# Patient Record
Sex: Female | Born: 1965 | Race: Asian | Hispanic: No | Marital: Married | Smoking: Never smoker
Health system: Southern US, Community
[De-identification: ages and names within clinical notes are randomized; demographics above are authoritative.]

## PROBLEM LIST (undated history)

## (undated) DIAGNOSIS — Z9221 Personal history of antineoplastic chemotherapy: Secondary | ICD-10-CM

## (undated) DIAGNOSIS — Z923 Personal history of irradiation: Secondary | ICD-10-CM

## (undated) DIAGNOSIS — I10 Essential (primary) hypertension: Secondary | ICD-10-CM

## (undated) HISTORY — PX: PORT-A-CATH REMOVAL: SHX5289

## (undated) HISTORY — DX: Personal history of irradiation: Z92.3

## (undated) HISTORY — DX: Essential (primary) hypertension: I10

---

## 2019-08-01 HISTORY — PX: HYSTEROSCOPY: SHX211

## 2019-08-14 DIAGNOSIS — N950 Postmenopausal bleeding: Secondary | ICD-10-CM | POA: Insufficient documentation

## 2019-08-14 DIAGNOSIS — D069 Carcinoma in situ of cervix, unspecified: Secondary | ICD-10-CM | POA: Insufficient documentation

## 2019-09-01 HISTORY — PX: INSERTION CENTRAL VENOUS ACCESS DEVICE W/ SUBCUTANEOUS PORT: SUR725

## 2019-09-20 DIAGNOSIS — C531 Malignant neoplasm of exocervix: Secondary | ICD-10-CM | POA: Insufficient documentation

## 2020-10-31 DIAGNOSIS — C801 Malignant (primary) neoplasm, unspecified: Secondary | ICD-10-CM

## 2020-10-31 HISTORY — DX: Malignant (primary) neoplasm, unspecified: C80.1

## 2021-09-04 DIAGNOSIS — I10 Essential (primary) hypertension: Secondary | ICD-10-CM | POA: Insufficient documentation

## 2022-02-03 ENCOUNTER — Encounter: Payer: Self-pay | Admitting: Physician Assistant

## 2022-02-03 ENCOUNTER — Ambulatory Visit (INDEPENDENT_AMBULATORY_CARE_PROVIDER_SITE_OTHER): Payer: 59 | Admitting: Physician Assistant

## 2022-02-03 ENCOUNTER — Encounter (INDEPENDENT_AMBULATORY_CARE_PROVIDER_SITE_OTHER): Payer: Self-pay

## 2022-02-03 ENCOUNTER — Telehealth: Payer: Self-pay

## 2022-02-03 DIAGNOSIS — Z7689 Persons encountering health services in other specified circumstances: Secondary | ICD-10-CM | POA: Diagnosis not present

## 2022-02-03 DIAGNOSIS — Z1231 Encounter for screening mammogram for malignant neoplasm of breast: Secondary | ICD-10-CM

## 2022-02-03 DIAGNOSIS — Z8541 Personal history of malignant neoplasm of cervix uteri: Secondary | ICD-10-CM

## 2022-02-03 DIAGNOSIS — I10 Essential (primary) hypertension: Secondary | ICD-10-CM | POA: Diagnosis not present

## 2022-02-03 DIAGNOSIS — R5383 Other fatigue: Secondary | ICD-10-CM

## 2022-02-03 MED ORDER — LOSARTAN POTASSIUM 25 MG PO TABS: 25.0000 mg | ORAL_TABLET | Freq: Every day | ORAL | 1 refills | Status: DC

## 2022-02-03 NOTE — Telephone Encounter (Signed)
Notified patient's son of mammogram appointment date & time-Toni ?

## 2022-02-03 NOTE — Progress Notes (Signed)
[facility] ?[address] ?[facility], [place] [address] ? ?Internal MEDICINE  ?Office Visit Note ? ?Patient Name: Kathryn Robbins ? [id number]  ?[id number] ? ?Date of Service: 02/09/2022 ? ? ?Complaints/HPI ?Pt is here for establishment of PCP. ?Chief Complaint  ?Patient presents with  ? New Patient (Initial Visit)  ? ?HPI ?Pt is here for establish care ?-moved from [place] 6 months ago  ?-hx of cervical cancer in 2020, treated with cisplatin and radiation by GYN-ONC. Was previously undergoing repeat imaging and is almost due for this at this time based on previous provider's treatment plan.  Patient therefore needs to establish with local GYN-ONC. ?-Has HTN and takes losartan. Has not taken it yet today. Does not check at home but does have a cuff if needed. ?-BP usually high in offices, did not improve on recheck ?-No other chronic medical conditions or medications aside from losartan ?-never been a smoker, does not drink any alcohol, no other substance use ?-lives with her son, daughter and husband ?-Does not work, does house work and will go for walks ?-Colonoscopy never done, is due for mammogram and would like this ordered today ?-We will also have her do routine fasting blood work ? ?Current Medication: ?Outpatient Encounter Medications as of 02/03/2022  ?Medication Sig  ? [DISCONTINUED] losartan (COZAAR) 25 MG tablet Take 25 mg by mouth daily.  ? losartan (COZAAR) 25 MG tablet Take 1 tablet (25 mg total) by mouth daily.  ? ?No facility-administered encounter medications on file as of 02/03/2022.  ? ? ?Surgical History: ?History reviewed. No pertinent surgical history. ? ?Medical History: ?Past Medical History:  ?Diagnosis Date  ? Cancer [facility]) 2022  ? Cervical  ? ? ?Family History: ?History reviewed. No pertinent family history. ? ?Social History  ? ?Socioeconomic History  ? Marital status: Unknown  ?  Spouse name: Not on file  ? Number of children: Not on file  ? Years of education: Not on file  ?  Highest education level: Not on file  ?Occupational History  ? Not on file  ?Tobacco Use  ? Smoking status: Never  ? Smokeless tobacco: Never  ?Substance and Sexual Activity  ? Alcohol use: Never  ? Drug use: Never  ? Sexual activity: Not on file  ?Other Topics Concern  ? Not on file  ?Social History Narrative  ? Not on file  ? ?Social Determinants of Health  ? ?Financial Resource Strain: Not on file  ?Food Insecurity: Not on file  ?Transportation Needs: Not on file  ?Physical Activity: Not on file  ?Stress: Not on file  ?Social Connections: Not on file  ?Intimate Partner Violence: Not on file  ? ? ? ?Review of Systems  ?Constitutional:  Negative for chills, fatigue and unexpected weight change.  ?HENT:  Negative for congestion, rhinorrhea, sneezing and sore throat.   ?Eyes:  Negative for redness.  ?Respiratory:  Negative for cough, chest tightness and shortness of breath.   ?Cardiovascular:  Negative for chest pain and palpitations.  ?Gastrointestinal:  Negative for abdominal pain, constipation, diarrhea, nausea and vomiting.  ?Genitourinary:  Negative for dysuria and frequency.  ?Musculoskeletal:  Negative for arthralgias, back pain, joint swelling and neck pain.  ?Skin:  Negative for rash.  ?Neurological: Negative.  Negative for tremors and numbness.  ?Hematological:  Negative for adenopathy. Does not bruise/bleed easily.  ?Psychiatric/Behavioral:  Negative for behavioral problems (Depression), sleep disturbance and suicidal ideas. The patient is not nervous/anxious.   ? ?Vital Signs: ?BP (!) 158/83  Pulse 87   Temp 98.1 ?F (36.7 ?C)   Resp 16   Ht '5\' 1"'$  (1.549 m)   Wt 112 lb 4.8 oz (50.9 kg)   SpO2 99%   BMI 21.22 kg/m?  ? ? ?Physical Exam ?Vitals and nursing note reviewed.  ?Constitutional:   ?   General: She is not in acute distress. ?   Appearance: She is well-developed. She is not diaphoretic.  ?HENT:  ?   Head: Normocephalic and atraumatic.  ?   Mouth/Throat:  ?   Pharynx: No oropharyngeal exudate.   ?Eyes:  ?   Pupils: Pupils are equal, round, and reactive to light.  ?Neck:  ?   Thyroid: No thyromegaly.  ?   Vascular: No JVD.  ?   Trachea: No tracheal deviation.  ?Cardiovascular:  ?   Rate and Rhythm: Normal rate and regular rhythm.  ?   Heart sounds: Normal heart sounds. No murmur heard. ?  No friction rub. No gallop.  ?Pulmonary:  ?   Effort: Pulmonary effort is normal. No respiratory distress.  ?   Breath sounds: No wheezing or rales.  ?Chest:  ?   Chest wall: No tenderness.  ?Abdominal:  ?   General: Bowel sounds are normal.  ?   Palpations: Abdomen is soft.  ?Musculoskeletal:     ?   General: Normal range of motion.  ?   Cervical back: Normal range of motion and neck supple.  ?Lymphadenopathy:  ?   Cervical: No cervical adenopathy.  ?Skin: ?   General: Skin is warm and dry.  ?Neurological:  ?   Mental Status: She is alert and oriented to person, place, and time.  ?   Cranial Nerves: No cranial nerve deficit.  ?Psychiatric:     ?   Behavior: Behavior normal.     ?   Thought Content: Thought content normal.     ?   Judgment: Judgment normal.  ? ? ? ? ?Assessment/Plan: ?1. Essential hypertension ?Elevated in office though patient has not taken her medication yet today and will do so now.  Will also monitor at home and bring recordings next visit.  We will have 4-week follow-up to recheck this as BP did not improve on recheck in office ?- losartan (COZAAR) 25 MG tablet; Take 1 tablet (25 mg total) by mouth daily.  Dispense: 90 tablet; Refill: 1 ? ?2. History of cervical cancer ?Go ahead and refer to local gynecologic oncology provider as patient was being routinely monitored posterior cervical cancer diagnosis in 2020 and requires follow-up imaging and close monitoring ?- Ambulatory referral to Gynecologic Oncology ? ?3. Visit for screening mammogram ?- MM DIGITAL SCREENING BILATERAL; Future ?- MM 3D SCREEN BREAST BILATERAL; Future ? ?4. Encounter to establish care ?- CBC w/Diff/Platelet ?- Comprehensive  metabolic panel ?- TSH + free T4 ?- Lipid Panel With LDL/HDL Ratio ? ?5. Other fatigue ?- CBC w/Diff/Platelet ?- Comprehensive metabolic panel ?- TSH + free T4 ?- Lipid Panel With LDL/HDL Ratio ? ? ?General Counseling: Michalina verbalizes understanding of the findings of todays visit and agrees with plan of treatment. I have discussed any further diagnostic evaluation that may be needed or ordered today. We also reviewed her medications today. she has been encouraged to call the office with any questions or concerns that should arise related to todays visit. ? ? ? ?Counseling: ? ? ? ?Orders Placed This Encounter  ?Procedures  ? MM DIGITAL SCREENING BILATERAL  ? MM 3D SCREEN BREAST BILATERAL  ?  CBC w/Diff/Platelet  ? Comprehensive metabolic panel  ? TSH + free T4  ? Lipid Panel With LDL/HDL Ratio  ? Ambulatory referral to Gynecologic Oncology  ? ? ?Meds ordered this encounter  ?Medications  ? losartan (COZAAR) 25 MG tablet  ?  Sig: Take 1 tablet (25 mg total) by mouth daily.  ?  Dispense:  90 tablet  ?  Refill:  1  ? ? ? ?This patient was seen by Drema Dallas, PA-C in collaboration with Dr. Clayborn Bigness as a part of collaborative care agreement. ? ? ?Time spent:40 Minutes ? ? ? ? ? ? ?

## 2022-02-04 LAB — CBC WITH DIFFERENTIAL/PLATELET
Basophils Absolute: 0 10*3/uL (ref 0.0–0.2)
Basos: 0 %
EOS (ABSOLUTE): 0.1 10*3/uL (ref 0.0–0.4)
Eos: 1 %
Hematocrit: 37.2 % (ref 34.0–46.6)
Hemoglobin: 12.1 g/dL (ref 11.1–15.9)
Immature Grans (Abs): 0 10*3/uL (ref 0.0–0.1)
Immature Granulocytes: 0 %
Lymphocytes Absolute: 2.1 10*3/uL (ref 0.7–3.1)
Lymphs: 23 %
MCH: 25.4 pg — ABNORMAL LOW (ref 26.6–33.0)
MCHC: 32.5 g/dL (ref 31.5–35.7)
MCV: 78 fL — ABNORMAL LOW (ref 79–97)
Monocytes Absolute: 0.4 10*3/uL (ref 0.1–0.9)
Monocytes: 5 %
Neutrophils Absolute: 6.8 10*3/uL (ref 1.4–7.0)
Neutrophils: 71 %
Platelets: 240 10*3/uL (ref 150–450)
RBC: 4.76 x10E6/uL (ref 3.77–5.28)
RDW: 12.5 % (ref 11.7–15.4)
WBC: 9.4 10*3/uL (ref 3.4–10.8)

## 2022-02-04 LAB — COMPREHENSIVE METABOLIC PANEL
ALT: 17 IU/L (ref 0–32)
AST: 18 IU/L (ref 0–40)
Albumin/Globulin Ratio: 1.7 (ref 1.2–2.2)
Albumin: 4.5 g/dL (ref 3.8–4.9)
Alkaline Phosphatase: 115 IU/L (ref 44–121)
BUN/Creatinine Ratio: 19 (ref 9–23)
BUN: 13 mg/dL (ref 6–24)
Bilirubin Total: 0.5 mg/dL (ref 0.0–1.2)
CO2: 21 mmol/L (ref 20–29)
Calcium: 9.3 mg/dL (ref 8.7–10.2)
Chloride: 104 mmol/L (ref 96–106)
Creatinine, Ser: 0.7 mg/dL (ref 0.57–1.00)
Globulin, Total: 2.6 g/dL (ref 1.5–4.5)
Glucose: 95 mg/dL (ref 70–99)
Potassium: 4.4 mmol/L (ref 3.5–5.2)
Sodium: 137 mmol/L (ref 134–144)
Total Protein: 7.1 g/dL (ref 6.0–8.5)
eGFR: 101 mL/min/{1.73_m2} (ref >59)

## 2022-02-04 LAB — LIPID PANEL WITH LDL/HDL RATIO
Cholesterol, Total: 210 mg/dL — ABNORMAL HIGH (ref 100–199)
HDL: 58 mg/dL (ref >39)
LDL Chol Calc (NIH): 137 mg/dL — ABNORMAL HIGH (ref 0–99)
LDL/HDL Ratio: 2.4 ratio (ref 0.0–3.2)
Triglycerides: 85 mg/dL (ref 0–149)
VLDL Cholesterol Cal: 15 mg/dL (ref 5–40)

## 2022-02-04 LAB — TSH+FREE T4
Free T4: 1.43 ng/dL (ref 0.82–1.77)
TSH: 1.59 u[IU]/mL (ref 0.450–4.500)

## 2022-02-08 ENCOUNTER — Telehealth: Payer: Self-pay

## 2022-02-08 ENCOUNTER — Telehealth: Payer: Self-pay | Admitting: *Deleted

## 2022-02-08 NOTE — Telephone Encounter (Signed)
Called and spoke with Kenney Houseman at [employer] [facility] office; requested cancer records from [place]  ?

## 2022-02-08 NOTE — Telephone Encounter (Signed)
Emailed medical release to patient's son to complete and return for records from [place]-Toni ?

## 2022-02-11 NOTE — Telephone Encounter (Signed)
Medical record request faxed to Dr. Oletta Lamas [phone] ?

## 2022-02-24 NOTE — Telephone Encounter (Signed)
Spoke with office, they stated they had not received request. I faxed again-Toni ?

## 2022-03-01 ENCOUNTER — Telehealth: Payer: Self-pay | Admitting: *Deleted

## 2022-03-01 NOTE — Telephone Encounter (Signed)
Spoke with the patient and scheduled a new patient appt with Dr Berline Lopes on 5/26 at 11:15 am.  Patient given an arrival time of *10:45 am. Patient also given the address and phone number for the clinic; along with the policy for mask and visitors.   ?

## 2022-03-03 ENCOUNTER — Ambulatory Visit (INDEPENDENT_AMBULATORY_CARE_PROVIDER_SITE_OTHER): Payer: 59 | Admitting: Physician Assistant

## 2022-03-03 ENCOUNTER — Encounter: Payer: Self-pay | Admitting: Physician Assistant

## 2022-03-03 DIAGNOSIS — Z8541 Personal history of malignant neoplasm of cervix uteri: Secondary | ICD-10-CM

## 2022-03-03 DIAGNOSIS — I10 Essential (primary) hypertension: Secondary | ICD-10-CM

## 2022-03-03 DIAGNOSIS — E782 Mixed hyperlipidemia: Secondary | ICD-10-CM | POA: Diagnosis not present

## 2022-03-03 MED ORDER — ROSUVASTATIN CALCIUM 5 MG PO TABS: ORAL_TABLET | ORAL | 3 refills | Status: DC

## 2022-03-03 NOTE — Progress Notes (Signed)
[facility] [place],  [address]  Internal MEDICINE  Office Visit Note  Patient Name: Kathryn Robbins  [id number]  [id number]  Date of Service: 03/09/2022  Chief Complaint  Patient presents with   Follow-up    HPI Pt ids here for follow up for HTN with her son -BP 140-150/80-90 at home before medication. Took her losartan at 8:30 this morning. Gets anxious in office and reads higher. -Will bring cuff next visit with log to ensure it is just elevated in office from white coat syndrome. May need to double losartan if elevated at home too. -Labs reviewed and showed elevated cholesterol--she is taking fish oil daily, will add '5mg'$  crestor twice per week. Her other labs were normal apart from low MCV, unfortunately iron studies could not be added and may need to check this in future. She denies any symptoms -She has her initial visit with GYN-ONC to establish with local provider to follow her cervical cancer later this month and will have mammogram as well  Current Medication: Outpatient Encounter Medications as of 03/03/2022  Medication Sig   losartan (COZAAR) 25 MG tablet Take 1 tablet (25 mg total) by mouth daily.   rosuvastatin (CRESTOR) 5 MG tablet Take 1 tablet by mouth at night twice per week   No facility-administered encounter medications on file as of 03/03/2022.    Surgical History: History reviewed. No pertinent surgical history.  Medical History: Past Medical History:  Diagnosis Date   Cancer ([place]) 2022   Cervical    Family History: History reviewed. No pertinent family history.  Social History   Socioeconomic History   Marital status: Unknown    Spouse name: Not on file   Number of children: Not on file   Years of education: Not on file   Highest education level: Not on file  Occupational History   Not on file  Tobacco Use   Smoking status: Never   Smokeless tobacco: Never  Substance and Sexual Activity   Alcohol use:  Never   Drug use: Never   Sexual activity: Not on file  Other Topics Concern   Not on file  Social History Narrative   Not on file   Social Determinants of Health   Financial Resource Strain: Not on file  Food Insecurity: Not on file  Transportation Needs: Not on file  Physical Activity: Not on file  Stress: Not on file  Social Connections: Not on file  Intimate Partner Violence: Not on file      Review of Systems  Constitutional:  Negative for chills, fatigue and unexpected weight change.  HENT:  Negative for congestion, rhinorrhea, sneezing and sore throat.   Eyes:  Negative for redness.  Respiratory:  Negative for cough, chest tightness and shortness of breath.   Cardiovascular:  Negative for chest pain and palpitations.  Gastrointestinal:  Negative for abdominal pain, constipation, diarrhea, nausea and vomiting.  Genitourinary:  Negative for dysuria and frequency.  Musculoskeletal:  Negative for arthralgias, back pain, joint swelling and neck pain.  Skin:  Negative for rash.  Neurological: Negative.  Negative for tremors and numbness.  Hematological:  Negative for adenopathy. Does not bruise/bleed easily.  Psychiatric/Behavioral:  Negative for behavioral problems (Depression), sleep disturbance and suicidal ideas. The patient is nervous/anxious.    Vital Signs: BP (!) 162/92 Comment: 181/89  Pulse 78   Temp 97.6 F (36.4 C)   Resp 16   Ht '5\' 1"'$  (1.549 m)   Wt 113 lb (51.3 kg)  SpO2 98%   BMI 21.35 kg/m    Physical Exam Vitals and nursing note reviewed.  Constitutional:      General: She is not in acute distress.    Appearance: She is well-developed. She is not diaphoretic.  HENT:     Head: Normocephalic and atraumatic.     Mouth/Throat:     Pharynx: No oropharyngeal exudate.  Eyes:     Pupils: Pupils are equal, round, and reactive to light.  Neck:     Thyroid: No thyromegaly.     Vascular: No JVD.     Trachea: No tracheal deviation.  Cardiovascular:      Rate and Rhythm: Normal rate and regular rhythm.     Heart sounds: Normal heart sounds. No murmur heard.   No friction rub. No gallop.  Pulmonary:     Effort: Pulmonary effort is normal. No respiratory distress.     Breath sounds: No wheezing or rales.  Chest:     Chest wall: No tenderness.  Abdominal:     General: Bowel sounds are normal.     Palpations: Abdomen is soft.  Musculoskeletal:        General: Normal range of motion.     Cervical back: Normal range of motion and neck supple.  Lymphadenopathy:     Cervical: No cervical adenopathy.  Skin:    General: Skin is warm and dry.  Neurological:     Mental Status: She is alert and oriented to person, place, and time.     Cranial Nerves: No cranial nerve deficit.  Psychiatric:        Behavior: Behavior normal.        Thought Content: Thought content normal.        Judgment: Judgment normal.       Assessment/Plan: 1. Essential hypertension Elevated in office again. Will monitor at home and if elevated will double losartan. Will bring cuff next visit to ensure elevated in office readings due to white coat syndrome and not due to poorly controlled HTN at home too  2. Mixed hyperlipidemia Will start crestor twice per week while continuing fish oil and working on diet and exercise - rosuvastatin (CRESTOR) 5 MG tablet; Take 1 tablet by mouth at night twice per week  Dispense: 30 tablet; Refill: 3  3. History of cervical cancer Has visit to establish with local provider to take over management of cervical cancer   General Counseling: Christyanna verbalizes understanding of the findings of todays visit and agrees with plan of treatment. I have discussed any further diagnostic evaluation that may be needed or ordered today. We also reviewed her medications today. she has been encouraged to call the office with any questions or concerns that should arise related to todays visit.    No orders of the defined types were placed  in this encounter.   Meds ordered this encounter  Medications   rosuvastatin (CRESTOR) 5 MG tablet    Sig: Take 1 tablet by mouth at night twice per week    Dispense:  30 tablet    Refill:  3    This patient was seen by Drema Dallas, PA-C in collaboration with Dr. Clayborn Bigness as a part of collaborative care agreement.   Total time spent:30 Minutes Time spent includes review of chart, medications, test results, and follow up plan with the patient.      Dr Lavera Guise Internal medicine

## 2022-03-16 ENCOUNTER — Ambulatory Visit
Admission: RE | Admit: 2022-03-16 | Discharge: 2022-03-16 | Disposition: A | Discharge status: Home / Self Care | Payer: 59 | Source: Ambulatory Visit | Attending: Physician Assistant | Admitting: Physician Assistant

## 2022-03-16 DIAGNOSIS — Z1231 Encounter for screening mammogram for malignant neoplasm of breast: Secondary | ICD-10-CM | POA: Insufficient documentation

## 2022-03-16 HISTORY — DX: Personal history of antineoplastic chemotherapy: Z92.21

## 2022-03-17 ENCOUNTER — Encounter: Payer: Self-pay | Admitting: Gynecologic Oncology

## 2022-03-23 ENCOUNTER — Telehealth: Payer: Self-pay | Admitting: *Deleted

## 2022-03-23 NOTE — Telephone Encounter (Signed)
Spoke with Kathryn Robbins today who asked if her insurance cover the visit for Dr.Tucker. Informed pt she can contact her insurance company and they would be able to answer that question for her. She was persistent in speaking with someone who would be able to tell her from our office if it's covered. Gave pt the number to billing [phone] to see if they can answer her question. Pt verbalized understanding.

## 2022-03-24 ENCOUNTER — Encounter: Payer: Self-pay | Admitting: Gynecologic Oncology

## 2022-03-24 NOTE — Progress Notes (Unsigned)
GYNECOLOGIC ONCOLOGY NEW PATIENT CONSULTATION   Patient Name: Kathryn Robbins  Patient Age: 56 y.o. Date of Service: 03/25/22 Referring Provider: Mylinda Latina, PA-C [place],  [place] [address]   Primary Care Provider: Mylinda Latina, PA-C Consulting Provider: Jeral Pinch, MD   Assessment/Plan:  ***   A copy of this note was sent to the patient's referring provider.   Jeral Pinch, MD  Division of Gynecologic Oncology  Department of Obstetrics and Gynecology  [facility]  ___________________________________________  Chief Complaint: No chief complaint on file.   History of Present Illness:  Kathryn Robbins is a 56 y.o. y.o. 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 She was treated at [facility] in [place].   Biopsies on 07/02/2019: EGD shows at least CIN-3, invasive basal carcinoma cannot be excluded.  Endometrial biopsy shows at least CIN-3.  Endometrium noted.  P16 staining is positive. On exam in late October, 2020, vaginal involvement was noted. Biopsies on 08/16/2019: D&C shows moderately to poorly differentiated keratinizing squamous cell carcinoma.  Normal endometrial mucosa not identified.  Endocervical curettage shows at least fragments of malignant/dysplastic squamous cells.  Cervical biopsy shows fragments of squamous mucosa with foci of invasive moderately differentiated squamous cell carcinoma.  LEEP shows invasive moderately to poorly differentiated squamous cell carcinoma, extensive.  Inked margins positive for tumor.  Posterior cervix, LEEP: Again show squamous cell carcinoma, tumor involves all fragments extensively. PET scan on 08/30/2019: Intense FDG metabolism of the cervix with mildly increased metabolism  within the vaginal vault.  No distant metastatic disease.  Radiation treatment summary: 26 Gray to the cervix and pelvic lymph nodes, 26 fractions, dose per fraction 1.8 Gray.  Patient then received HDR with T and no, total dose of 30 Gray, 6 fractions.  Seen by radiation oncology on 03/05/21. She was NED at that time on exam. CT C/A/P from 03/03/21 revealed no metastatic disease. Seen 08/2021. Continued to be NED at that time, some vaginal stenosis. CT C/A/P 08/31/21 revealed no evidence of disease.   PAST MEDICAL HISTORY:  Past Medical History:  Diagnosis Date   Cancer ([place]) 2022   Cervical   Hypertension    Personal history of chemotherapy      PAST SURGICAL HISTORY:  History reviewed. No pertinent surgical history.  OB/GYN HISTORY:  OB History  No obstetric history on file.    No LMP recorded. Patient is postmenopausal.  Age at menarche: ***  Age at menopause: *** Hx of HRT: *** Hx of STDs: *** Last pap: *** History of abnormal pap smears: ***  SCREENING STUDIES:  Last mammogram: ***  Last colonoscopy: *** Last bone mineral density: ***  MEDICATIONS: Outpatient Encounter Medications as of 03/25/2022  Medication Sig   losartan (COZAAR) 50 MG tablet Take by mouth.   rosuvastatin (CRESTOR) 5 MG tablet Take 1 tablet by mouth at night twice per week   [DISCONTINUED] losartan (COZAAR) 25 MG tablet Take 1 tablet (25 mg total) by mouth daily.   No facility-administered encounter medications on file as of 03/25/2022.    ALLERGIES:  Allergies  Allergen Reactions   Keflex [Cephalexin] Rash     FAMILY HISTORY:  Family History  Problem Relation Age of Onset   Breast cancer Neg Hx  SOCIAL HISTORY:  Social Connections: Not on file    REVIEW OF SYSTEMS:  Denies appetite changes, fevers, chills, fatigue, unexplained weight changes. Denies hearing loss, neck lumps or masses, mouth sores, ringing in ears or voice changes. Denies cough or wheezing.  Denies shortness of  breath. Denies chest pain or palpitations. Denies leg swelling. Denies abdominal distention, pain, blood in stools, constipation, diarrhea, nausea, vomiting, or early satiety. Denies pain with intercourse, dysuria, frequency, hematuria or incontinence. Denies hot flashes, pelvic pain, vaginal bleeding or vaginal discharge.   Denies joint pain, back pain or muscle pain/cramps. Denies itching, rash, or wounds. Denies dizziness, headaches, numbness or seizures. Denies swollen lymph nodes or glands, denies easy bruising or bleeding. Denies anxiety, depression, confusion, or decreased concentration.  Physical Exam:  Vital Signs for this encounter:  There were no vitals taken for this visit. There is no height or weight on file to calculate BMI. General: Alert, oriented, no acute distress.  HEENT: Normocephalic, atraumatic. Sclera anicteric.  Chest: Clear to auscultation bilaterally. No wheezes, rhonchi, or rales. Cardiovascular: Regular rate and rhythm, no murmurs, rubs, or gallops.  Abdomen: ***Obese. Normoactive bowel sounds. Soft, nondistended, nontender to palpation. No masses or hepatosplenomegaly appreciated. No palpable fluid wave.  Extremities: Grossly normal range of motion. Warm, well perfused. No edema bilaterally.  Skin: No rashes or lesions.  Lymphatics: No cervical, supraclavicular, or inguinal adenopathy.  GU:  Normal external female genitalia. ***  No lesions. No discharge or bleeding.             Bladder/urethra:  No lesions or masses, well supported bladder             Vagina: ***             Cervix: Normal appearing, no lesions.             Uterus: *** Small, mobile, no parametrial involvement or nodularity.             Adnexa: *** masses.  Rectal: ***  LABORATORY AND RADIOLOGIC DATA:  ***Outside medical records were reviewed to synthesize the above history, along with the history and physical obtained during the visit.   Lab Results  Component Value Date   WBC 9.4  02/03/2022   HGB 12.1 02/03/2022   HCT 37.2 02/03/2022   PLT 240 02/03/2022   GLUCOSE 95 02/03/2022   CHOL 210 (H) 02/03/2022   TRIG 85 02/03/2022   HDL 58 02/03/2022   LDLCALC 137 (H) 02/03/2022   ALT 17 02/03/2022   AST 18 02/03/2022   NA 137 02/03/2022   K 4.4 02/03/2022   CL 104 02/03/2022   CREATININE 0.70 02/03/2022   BUN 13 02/03/2022   CO2 21 02/03/2022   TSH 1.590 02/03/2022

## 2022-03-25 ENCOUNTER — Inpatient Hospital Stay: Payer: 59 | Attending: Gynecologic Oncology | Admitting: Gynecologic Oncology

## 2022-03-29 ENCOUNTER — Inpatient Hospital Stay
Admission: RE | Admit: 2022-03-29 | Discharge: 2022-03-29 | Disposition: A | Discharge status: Home / Self Care | Payer: Self-pay | Source: Ambulatory Visit | Attending: *Deleted | Admitting: *Deleted

## 2022-03-29 ENCOUNTER — Telehealth: Payer: Self-pay | Admitting: *Deleted

## 2022-03-29 ENCOUNTER — Other Ambulatory Visit: Payer: Self-pay | Admitting: *Deleted

## 2022-03-29 DIAGNOSIS — Z1231 Encounter for screening mammogram for malignant neoplasm of breast: Secondary | ICD-10-CM

## 2022-03-29 NOTE — Telephone Encounter (Signed)
Spoke with the son and scheduled a new patient appt on 6/12 at 10:30 am.

## 2022-04-01 ENCOUNTER — Encounter: Payer: Self-pay | Admitting: Physician Assistant

## 2022-04-01 ENCOUNTER — Ambulatory Visit (INDEPENDENT_AMBULATORY_CARE_PROVIDER_SITE_OTHER): Payer: 59 | Admitting: Physician Assistant

## 2022-04-01 DIAGNOSIS — Z0001 Encounter for general adult medical examination with abnormal findings: Secondary | ICD-10-CM

## 2022-04-01 DIAGNOSIS — Z01419 Encounter for gynecological examination (general) (routine) without abnormal findings: Secondary | ICD-10-CM

## 2022-04-01 DIAGNOSIS — I10 Essential (primary) hypertension: Secondary | ICD-10-CM | POA: Diagnosis not present

## 2022-04-01 DIAGNOSIS — Z8541 Personal history of malignant neoplasm of cervix uteri: Secondary | ICD-10-CM | POA: Diagnosis not present

## 2022-04-01 DIAGNOSIS — Z1211 Encounter for screening for malignant neoplasm of colon: Secondary | ICD-10-CM

## 2022-04-01 DIAGNOSIS — R0989 Other specified symptoms and signs involving the circulatory and respiratory systems: Secondary | ICD-10-CM

## 2022-04-01 DIAGNOSIS — R300 Dysuria: Secondary | ICD-10-CM

## 2022-04-01 DIAGNOSIS — Z1212 Encounter for screening for malignant neoplasm of rectum: Secondary | ICD-10-CM

## 2022-04-01 NOTE — Progress Notes (Signed)
[facility] [place], [place] [address]  Internal MEDICINE  Office Visit Note  Patient Name: Kathryn Robbins  [id number]  [id number]  Date of Service: 04/01/2022  Chief Complaint  Patient presents with   Annual Exam   Hypertension   Quality Metric Gaps    Colonoscopy      HPI Pt is here for routine health maintenance examination -States they had to reschedule GYN-ONC visit for the 12th to establish with local provider to manage hx of cervical cancer -Mammogram done and was normal -BP at home has been 135/ 85-90 -Reports elevated readings anytime she is in a medical office therefore had her bring her cuff in to compare to office readings to ensure accurate home readings. Manual reading in office 148/90, following this her cuff in office read 148/97. This is reassuring that her home readings are relatively accurate and is controlled outside of office -tolerating crestor twice per week. Did discuss checking carotid US -not really exercising and discussed incorporating something like daily walking -due for colonoscopy  Current Medication: Outpatient Encounter Medications as of 04/01/2022  Medication Sig   losartan (COZAAR) 50 MG tablet Take by mouth.   rosuvastatin (CRESTOR) 5 MG tablet Take 1 tablet by mouth at night twice per week   No facility-administered encounter medications on file as of 04/01/2022.    Surgical History: Past Surgical History:  Procedure Laterality Date   HYSTEROSCOPY  08/2019   with cervical conization and D&C   INSERTION CENTRAL VENOUS ACCESS DEVICE W/ SUBCUTANEOUS PORT  09/2019    Medical History: Past Medical History:  Diagnosis Date   Cancer ([place]) 2022   Cervical   History of radiation therapy    Hypertension    Personal history of chemotherapy     Family History: Family History  Problem Relation Age of Onset   Lung cancer Mother    Breast cancer Neg Hx       Review of Systems  Constitutional:   Negative for chills, fatigue and unexpected weight change.  HENT:  Negative for congestion, rhinorrhea, sneezing and sore throat.   Eyes:  Negative for redness.  Respiratory:  Negative for cough, chest tightness and shortness of breath.   Cardiovascular:  Negative for chest pain and palpitations.  Gastrointestinal:  Negative for abdominal pain, constipation, diarrhea, nausea and vomiting.  Genitourinary:  Negative for dysuria and frequency.  Musculoskeletal:  Negative for arthralgias, back pain, joint swelling and neck pain.  Skin:  Negative for rash.  Neurological: Negative.  Negative for tremors and numbness.  Hematological:  Negative for adenopathy. Does not bruise/bleed easily.  Psychiatric/Behavioral:  Negative for behavioral problems (Depression), sleep disturbance and suicidal ideas. The patient is nervous/anxious.     Vital Signs: BP (!) 148/90   Pulse 73   Temp 97.8 F (36.6 C)   Resp 16   Ht $R'5\' 1"'pf$  (1.549 m)   Wt 111 lb (50.3 kg)   SpO2 99%   BMI 20.97 kg/m    Physical Exam Vitals and nursing note reviewed.  Constitutional:      General: She is not in acute distress.    Appearance: She is well-developed. She is not diaphoretic.  HENT:     Head: Normocephalic and atraumatic.     Mouth/Throat:     Pharynx: No oropharyngeal exudate.  Eyes:     Pupils: Pupils are equal, round, and reactive to light.  Neck:     Thyroid: No thyromegaly.     Vascular: No JVD.  Trachea: No tracheal deviation.  Cardiovascular:     Rate and Rhythm: Normal rate and regular rhythm.     Heart sounds: Normal heart sounds. No murmur heard.   No friction rub. No gallop.  Pulmonary:     Effort: Pulmonary effort is normal. No respiratory distress.     Breath sounds: No wheezing or rales.  Chest:     Chest wall: No tenderness.  Breasts:    Right: Normal. No mass.     Left: Normal. No mass.  Abdominal:     General: Bowel sounds are normal.     Palpations: Abdomen is soft.      Tenderness: There is no abdominal tenderness.  Musculoskeletal:        General: Normal range of motion.     Cervical back: Normal range of motion and neck supple.  Lymphadenopathy:     Cervical: No cervical adenopathy.  Skin:    General: Skin is warm and dry.  Neurological:     Mental Status: She is alert and oriented to person, place, and time.     Cranial Nerves: No cranial nerve deficit.  Psychiatric:        Behavior: Behavior normal.        Thought Content: Thought content normal.        Judgment: Judgment normal.     LABS: Recent Results (from the past 2160 hour(s))  CBC w/Diff/Platelet     Status: Abnormal   Collection Time: 02/03/22 10:29 AM  Result Value Ref Range   WBC 9.4 3.4 - 10.8 x10E3/uL   RBC 4.76 3.77 - 5.28 x10E6/uL   Hemoglobin 12.1 11.1 - 15.9 g/dL   Hematocrit 37.2 34.0 - 46.6 %   MCV 78 (L) 79 - 97 fL   MCH 25.4 (L) 26.6 - 33.0 pg   MCHC 32.5 31.5 - 35.7 g/dL   RDW 12.5 11.7 - 15.4 %   Platelets 240 150 - 450 x10E3/uL   Neutrophils 71 Not Estab. %   Lymphs 23 Not Estab. %   Monocytes 5 Not Estab. %   Eos 1 Not Estab. %   Basos 0 Not Estab. %   Neutrophils Absolute 6.8 1.4 - 7.0 x10E3/uL   Lymphocytes Absolute 2.1 0.7 - 3.1 x10E3/uL   Monocytes Absolute 0.4 0.1 - 0.9 x10E3/uL   EOS (ABSOLUTE) 0.1 0.0 - 0.4 x10E3/uL   Basophils Absolute 0.0 0.0 - 0.2 x10E3/uL   Immature Granulocytes 0 Not Estab. %   Immature Grans (Abs) 0.0 0.0 - 0.1 x10E3/uL  Comprehensive metabolic panel     Status: None   Collection Time: 02/03/22 10:29 AM  Result Value Ref Range   Glucose 95 70 - 99 mg/dL   BUN 13 6 - 24 mg/dL   Creatinine, Ser 0.70 0.57 - 1.00 mg/dL   eGFR 101 >59 mL/min/1.73   BUN/Creatinine Ratio 19 9 - 23   Sodium 137 134 - 144 mmol/L   Potassium 4.4 3.5 - 5.2 mmol/L   Chloride 104 96 - 106 mmol/L   CO2 21 20 - 29 mmol/L   Calcium 9.3 8.7 - 10.2 mg/dL   Total Protein 7.1 6.0 - 8.5 g/dL   Albumin 4.5 3.8 - 4.9 g/dL   Globulin, Total 2.6 1.5 - 4.5  g/dL   Albumin/Globulin Ratio 1.7 1.2 - 2.2   Bilirubin Total 0.5 0.0 - 1.2 mg/dL   Alkaline Phosphatase 115 44 - 121 IU/L   AST 18 0 - 40 IU/L   ALT 17 0 -  32 IU/L  TSH + free T4     Status: None   Collection Time: 02/03/22 10:29 AM  Result Value Ref Range   TSH 1.590 0.450 - 4.500 uIU/mL   Free T4 1.43 0.82 - 1.77 ng/dL  Lipid Panel With LDL/HDL Ratio     Status: Abnormal   Collection Time: 02/03/22 10:29 AM  Result Value Ref Range   Cholesterol, Total 210 (H) 100 - 199 mg/dL   Triglycerides 85 0 - 149 mg/dL   HDL 58 >39 mg/dL   VLDL Cholesterol Cal 15 5 - 40 mg/dL   LDL Chol Calc (NIH) 137 (H) 0 - 99 mg/dL   LDL/HDL Ratio 2.4 0.0 - 3.2 ratio    Comment:                                     LDL/HDL Ratio                                             Men  Women                               1/2 Avg.Risk  1.0    1.5                                   Avg.Risk  3.6    3.2                                2X Avg.Risk  6.2    5.0                                3X Avg.Risk  8.0    6.1         Assessment/Plan: 1. Encounter for general adult medical examination with abnormal findings CPE performed, labs previously reviewed, mammogram UTD, due for colonoscopy  2. White coat syndrome with diagnosis of hypertension Home cuff similar to manual reading therefore controlled at home on current medication and will continue current dose  3. History of cervical cancer Will have visit with GYN-ONC  4. Bilateral carotid bruits Will check carotid US - US Carotid Duplex Bilateral; Future  5. Visit for gynecologic examination Breast exam performed  6. Screening for colorectal cancer - Ambulatory referral to Gastroenterology  7. Dysuria - UA/M w/rflx Culture, Routine   General Counseling: Jaycelynn verbalizes understanding of the findings of todays visit and agrees with plan of treatment. I have discussed any further diagnostic evaluation that may be needed or ordered today. We also  reviewed her medications today. she has been encouraged to call the office with any questions or concerns that should arise related to todays visit.    Counseling:    Orders Placed This Encounter  Procedures   US Carotid Duplex Bilateral   UA/M w/rflx Culture, Routine   Ambulatory referral to Gastroenterology    No orders of the defined types were placed in this encounter.   This patient was seen by Drema Dallas, PA-C in collaboration with Dr. Clayborn Bigness as a  part of collaborative care agreement.  Total time spent:35 Minutes  Time spent includes review of chart, medications, test results, and follow up plan with the patient.     Lavera Guise, MD  Internal Medicine

## 2022-04-02 LAB — MICROSCOPIC EXAMINATION
Bacteria, UA: NONE SEEN
Casts: NONE SEEN /lpf
Epithelial Cells (non renal): NONE SEEN /hpf (ref 0–10)
RBC, Urine: NONE SEEN /hpf (ref 0–2)
WBC, UA: NONE SEEN /hpf (ref 0–5)

## 2022-04-02 LAB — UA/M W/RFLX CULTURE, ROUTINE
Bilirubin, UA: NEGATIVE
Glucose, UA: NEGATIVE
Ketones, UA: NEGATIVE
Leukocytes,UA: NEGATIVE
Nitrite, UA: NEGATIVE
Protein,UA: NEGATIVE
RBC, UA: NEGATIVE
Specific Gravity, UA: 1.009 (ref 1.005–1.030)
Urobilinogen, Ur: 0.2 mg/dL (ref 0.2–1.0)
pH, UA: 8 — ABNORMAL HIGH (ref 5.0–7.5)

## 2022-04-04 ENCOUNTER — Telehealth: Payer: Self-pay

## 2022-04-04 ENCOUNTER — Other Ambulatory Visit: Payer: Self-pay

## 2022-04-04 DIAGNOSIS — Z1211 Encounter for screening for malignant neoplasm of colon: Secondary | ICD-10-CM

## 2022-04-04 MED ORDER — NA SULFATE-K SULFATE-MG SULF 17.5-3.13-1.6 GM/177ML PO SOLN: 1.0000 | Freq: Once | ORAL | 0 refills | Status: AC

## 2022-04-04 NOTE — Telephone Encounter (Signed)
Gastroenterology Pre-Procedure Review  Request Date: 06/20 Requesting Physician: Dr. Vicente Males  PATIENT REVIEW QUESTIONS: The patient responded to the following health history questions as indicated:    1. Are you having any GI issues? no 2. Do you have a personal history of Polyps? no 3. Do you have a family history of Colon Cancer or Polyps? no 4. Diabetes Mellitus? no 5. Joint replacements in the past 12 months?no 6. Major health problems in the past 3 months? Uterine cancer 2years ago 7. Any artificial heart valves, MVP, or defibrillator?no    MEDICATIONS & ALLERGIES:    Patient reports the following regarding taking any anticoagulation/antiplatelet therapy:   Plavix, Coumadin, Eliquis, Xarelto, Lovenox, Pradaxa, Brilinta, or Effient? no Aspirin? no  Patient confirms/reports the following medications:  Current Outpatient Medications  Medication Sig Dispense Refill   losartan (COZAAR) 50 MG tablet Take by mouth.     rosuvastatin (CRESTOR) 5 MG tablet Take 1 tablet by mouth at night twice per week 30 tablet 3   No current facility-administered medications for this visit.    Patient confirms/reports the following allergies:  Allergies  Allergen Reactions   Keflex [Cephalexin] Rash    No orders of the defined types were placed in this encounter.   AUTHORIZATION INFORMATION Primary Insurance: 1D#: Group #:  Secondary Insurance: 1D#: Group #:  SCHEDULE INFORMATION: Date:  Time: Location:

## 2022-04-04 NOTE — Telephone Encounter (Signed)
Colonoscopy triage was completed with the assistance of [facility] [employer] [place].

## 2022-04-05 ENCOUNTER — Encounter: Payer: Self-pay | Admitting: Gynecologic Oncology

## 2022-04-11 ENCOUNTER — Other Ambulatory Visit (HOSPITAL_COMMUNITY)
Admission: RE | Admit: 2022-04-11 | Discharge: 2022-04-11 | Disposition: A | Discharge status: Home / Self Care | Payer: 59 | Source: Ambulatory Visit | Attending: Gynecologic Oncology | Admitting: Gynecologic Oncology

## 2022-04-11 ENCOUNTER — Other Ambulatory Visit: Payer: Self-pay

## 2022-04-11 ENCOUNTER — Inpatient Hospital Stay: Payer: 59 | Attending: Gynecologic Oncology | Admitting: Gynecologic Oncology

## 2022-04-11 ENCOUNTER — Encounter: Payer: Self-pay | Admitting: Gynecologic Oncology

## 2022-04-11 ENCOUNTER — Inpatient Hospital Stay: Payer: 59

## 2022-04-11 VITALS — BP 148/78 | HR 79 | Temp 98.4°F | Resp 16 | Ht 61.0 in | Wt 113.2 lb

## 2022-04-11 DIAGNOSIS — Z923 Personal history of irradiation: Secondary | ICD-10-CM | POA: Diagnosis not present

## 2022-04-11 DIAGNOSIS — I10 Essential (primary) hypertension: Secondary | ICD-10-CM | POA: Diagnosis not present

## 2022-04-11 DIAGNOSIS — Z8541 Personal history of malignant neoplasm of cervix uteri: Secondary | ICD-10-CM | POA: Insufficient documentation

## 2022-04-11 DIAGNOSIS — N895 Stricture and atresia of vagina: Secondary | ICD-10-CM | POA: Diagnosis not present

## 2022-04-11 DIAGNOSIS — Z9221 Personal history of antineoplastic chemotherapy: Secondary | ICD-10-CM | POA: Diagnosis not present

## 2022-04-11 LAB — BUN & CREATININE (CHCC)
BUN: 11 mg/dL (ref 6–20)
Creatinine: 0.74 mg/dL (ref 0.44–1.00)
GFR, Estimated: >60 mL/min (ref >60)

## 2022-04-11 NOTE — Progress Notes (Signed)
GYNECOLOGIC ONCOLOGY NEW PATIENT CONSULTATION   Patient Name: Kathryn Robbins  Patient Age: 56 y.o. 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  Patient is overall doing well and is NED on exam today.  I have extensively reviewed her records and confirmed treatment details with her.  It has been just over 6 months since her last office visit and CT scan.  She reports use of vaginal dilator but on exam today, she has had almost total agglutination of her vagina.  Speculum exam and bimanual are extremely limited, rectovaginal exam reveals no obvious masses or nodularity.  Although what is described as "some vaginal stenosis" is described in outside clinic notes, I suspect that the reason she has been getting every 6 month imaging has been related to the limitations of her physical exam to visualize or palpate possible recurrence.  I recommended that we proceed with CT scan.  Although I do not see documentation of this in her outside records, the patient thinks that it has been about a year since her last Pap test was done.  Although I was not able to visualize or palpate the cervix, I performed a vaginal Pap and HPV testing which was sent today.  Per NCCN surveillance recommendations, we will continue with surveillance visits every 6 months until 5 years after completion of treatment, which will be in 10/2024.  We reviewed signs and symptoms that would be concerning for cancer recurrence, and I stressed the need of calling my office to be seen sooner if she develops any of these symptoms.  A  copy of this note was sent to the patient's referring provider.   50 minutes of total time was spent for this patient encounter, including preparation, face-to-face counseling with the patient and coordination of care, and documentation of the encounter.   Jeral Pinch, MD  Division of Gynecologic Oncology  Department of Obstetrics and Gynecology  [facility]  ___________________________________________  Chief Complaint: Chief Complaint  Patient presents with   History of cervical cancer    History of Present Illness:  Kathryn Robbins is a 57 y.o. y.o. female who is seen in consultation at the request of Mylinda Latina, PA* for an evaluation of reestablishing care in the setting of a history of cervix cancer.  Patient has a history of stage IIA SCC of the cervix treated with primary chemoradiation (EBRT 45 Gy and HDR with T&O 30 Gy). She was treated at [facility] in [place].    Biopsies on 07/02/2019: EGD shows at least CIN-3, invasive basal carcinoma cannot be excluded.  Endometrial biopsy shows at least CIN-3.  Endometrium noted.  P16 staining is positive. On exam in late October, 2020, vaginal involvement was noted. Biopsies on 08/16/2019: D&C shows moderately to poorly differentiated keratinizing squamous cell carcinoma.  Normal endometrial mucosa not identified.  Endocervical curettage shows at least fragments of malignant/dysplastic squamous cells.  Cervical biopsy shows fragments of squamous mucosa with foci of invasive moderately differentiated squamous cell carcinoma.  LEEP shows invasive moderately to poorly differentiated squamous cell carcinoma, extensive.  Inked margins positive for tumor.  Posterior cervix, LEEP: Again show squamous cell  carcinoma, tumor involves all fragments extensively. PET scan on 08/30/2019: Intense FDG metabolism of the cervix with mildly increased metabolism within the vaginal vault.  No distant metastatic  disease. Radiation treatment summary: 98 Gray to the cervix and pelvic lymph nodes, 26 fractions, dose per fraction 1.8 Gray.  Patient then received HDR with T&O, total dose of 30 Gray, 6 fractions. Completed radiation on 11/21/2019.   Seen by radiation oncology on 03/05/21. She was NED at that time on exam. CT C/A/P from 03/03/21 revealed no metastatic disease. Seen 08/2021. Continued to be NED at that time, some vaginal stenosis. CT C/A/P 08/31/21 revealed no evidence of disease.   The patient reports moving from [place] in November last year.  This was to join family in the area.  She is currently living with her husband, son and daughter, and her sister.  She works as a Agricultural engineer.  She denies any vaginal bleeding or discharge.  Denies any pelvic or abdominal pain.  She reports normal bowel function.  Has some burning when she urinates which is unchanged since she finished radiation.  She endorses a normal appetite without nausea or emesis.  PAST MEDICAL HISTORY:  Past Medical History:  Diagnosis Date   Cancer ([place]) 2022   Cervical   History of radiation therapy    Hypertension    Personal history of chemotherapy      PAST SURGICAL HISTORY:  Past Surgical History:  Procedure Laterality Date   HYSTEROSCOPY  08/2019   with cervical conization and D&C   INSERTION CENTRAL VENOUS ACCESS DEVICE W/ SUBCUTANEOUS PORT  09/2019   PORT-A-CATH REMOVAL     2021    OB/GYN HISTORY:  OB History  Gravida Para Term Preterm AB Living  2 2          SAB IAB Ectopic Multiple Live Births               # Outcome Date GA Lbr Len/2nd Weight Sex Delivery Anes PTL Lv  2 Para           1 Para             No LMP recorded. Patient is postmenopausal.  Age at menarche: 53 Age at menopause: 1 Hx of HRT: Denies Hx of STDs: HPV Last pap: patient reports having her last Pap smear approximately 1 year ago History of abnormal pap smears: See HPI  SCREENING STUDIES:  Last mammogram: 02/2022  Last  colonoscopy: Has not had  MEDICATIONS: Outpatient Encounter Medications as of 04/11/2022  Medication Sig   losartan (COZAAR) 50 MG tablet Take by mouth.   rosuvastatin (CRESTOR) 5 MG tablet Take 1 tablet by mouth at night twice per week   No facility-administered encounter medications on file as of 04/11/2022.    ALLERGIES:  Allergies  Allergen Reactions   Cefadroxil Itching   Keflex [Cephalexin] Rash     FAMILY HISTORY:  Family History  Problem Relation Age of Onset   Liver cancer Mother    Breast cancer Neg Hx    Colon cancer Neg Hx    Ovarian cancer Neg Hx    Endometrial cancer Neg Hx    Pancreatic cancer Neg Hx    Prostate cancer Neg Hx      SOCIAL HISTORY:  Social Connections: Not on file    REVIEW OF SYSTEMS:  Denies appetite changes, fevers, chills, fatigue, unexplained weight changes. Denies hearing loss, neck lumps or masses, mouth sores, ringing in ears or voice changes. Denies cough  or wheezing.  Denies shortness of breath. Denies chest pain or palpitations. Denies leg swelling. Denies abdominal distention, pain, blood in stools, constipation, diarrhea, nausea, vomiting, or early satiety. Denies pain with intercourse, dysuria, frequency, hematuria or incontinence. Denies hot flashes, pelvic pain, vaginal bleeding or vaginal discharge.   Denies joint pain, back pain or muscle pain/cramps. Denies itching, rash, or wounds. Denies dizziness, headaches, numbness or seizures. Denies swollen lymph nodes or glands, denies easy bruising or bleeding. Denies anxiety, depression, confusion, or decreased concentration.  Physical Exam:  Vital Signs for this encounter:  Blood pressure (!) 148/78, pulse 79, temperature 98.4 F (36.9 C), temperature source Oral, resp. rate 16, height '5\' 1"'$  (1.549 m), weight 113 lb 3.2 oz (51.3 kg), SpO2 99 %. Body mass index is 21.39 kg/m. General: Alert, oriented, no acute distress.  HEENT: Normocephalic, atraumatic. Sclera anicteric.   Chest: Clear to auscultation bilaterally. No wheezes, rhonchi, or rales. Cardiovascular: Regular rate and rhythm, no murmurs, rubs, or gallops.  Abdomen: Normoactive bowel sounds. Soft, nondistended, nontender to palpation. No masses or hepatosplenomegaly appreciated. No palpable fluid wave.  Extremities: Grossly normal range of motion. Warm, well perfused. No edema bilaterally.  Skin: No rashes or lesions.  Lymphatics: No cervical, supraclavicular, or inguinal adenopathy.  GU:  Normal external female genitalia.  No lesions. No discharge or bleeding.             Bladder/urethra:  No lesions or masses.             Vagina: Significant atrophy of the vagina with radiation changes present.  I am only able to introduce a speculum 1-2 cm into the vagina past the urethra before complete agglutination is noted.  Pap test and HPV collected from the apex of what I am able to see.             Cervix: Not appreciable given vaginal agglutination.             Uterus: On rectal exam, uterus not definitively appreciated.             Adnexa: No masses appreciated.  Rectal: No nodularity along rectovaginal septum or posterior cervix.  No parametrial nodularity or thickening noted.  LABORATORY AND RADIOLOGIC DATA:  Outside medical records were reviewed to synthesize the above history, along with the history and physical obtained during the visit.   Lab Results  Component Value Date   WBC 9.4 02/03/2022   HGB 12.1 02/03/2022   HCT 37.2 02/03/2022   PLT 240 02/03/2022   GLUCOSE 95 02/03/2022   CHOL 210 (H) 02/03/2022   TRIG 85 02/03/2022   HDL 58 02/03/2022   LDLCALC 137 (H) 02/03/2022   ALT 17 02/03/2022   AST 18 02/03/2022   NA 137 02/03/2022   K 4.4 02/03/2022   CL 104 02/03/2022   CREATININE 0.74 04/11/2022   BUN 11 04/11/2022   CO2 21 02/03/2022   TSH 1.590 02/03/2022

## 2022-04-11 NOTE — Patient Instructions (Addendum)
It was very nice to meet you today.  While I do not see or feel anything on your exam, your exam is limited because much of the vagina has closed after your radiation.  I do not think that you need to use the dilator anymore given the degree of closure that has already happened.  Because this makes it harder for me to assess for the cancer having come back on your exam, I recommend that we get a CT scan.  I will call you with these results as well as the Pap test that I did today.  We will plan on visits every 6 months until 5 years after treatment.  Between visits, if you develop any of the symptoms that we talked about today, such as vaginal bleeding, pelvic pain, or unintentional weight loss, please call to see me sooner.  My schedule is not out past September at this time.  Please call back in October or November to schedule a visit to see me in mid December.

## 2022-04-15 ENCOUNTER — Telehealth: Payer: Self-pay

## 2022-04-15 LAB — CYTOLOGY - PAP
Comment: NEGATIVE
Diagnosis: NEGATIVE
Diagnosis: REACTIVE
High risk HPV: NEGATIVE

## 2022-04-15 NOTE — Telephone Encounter (Signed)
Spoke with patient using [facility] [employer] ID# [id number].  Informed patient that PAP and HPV testing is negative.  Patient verbalized understanding and was told to call with any concerns.

## 2022-04-18 ENCOUNTER — Other Ambulatory Visit: Payer: 59

## 2022-04-18 ENCOUNTER — Encounter: Payer: Self-pay | Admitting: Gastroenterology

## 2022-04-19 ENCOUNTER — Encounter: Payer: Self-pay | Admitting: Gastroenterology

## 2022-04-19 ENCOUNTER — Ambulatory Visit: Payer: 59 | Admitting: Registered Nurse

## 2022-04-19 ENCOUNTER — Ambulatory Visit
Admission: RE | Admit: 2022-04-19 | Discharge: 2022-04-19 | Disposition: A | Discharge status: Home / Self Care | Payer: 59 | Attending: Gastroenterology | Admitting: Gastroenterology

## 2022-04-19 ENCOUNTER — Encounter
Admission: RE | Disposition: A | Discharge status: Home / Self Care | Payer: Self-pay | Source: Home / Self Care | Attending: Gastroenterology

## 2022-04-19 DIAGNOSIS — Z79899 Other long term (current) drug therapy: Secondary | ICD-10-CM | POA: Insufficient documentation

## 2022-04-19 DIAGNOSIS — Z1211 Encounter for screening for malignant neoplasm of colon: Secondary | ICD-10-CM | POA: Diagnosis present

## 2022-04-19 DIAGNOSIS — Z9221 Personal history of antineoplastic chemotherapy: Secondary | ICD-10-CM | POA: Diagnosis not present

## 2022-04-19 DIAGNOSIS — I10 Essential (primary) hypertension: Secondary | ICD-10-CM | POA: Diagnosis not present

## 2022-04-19 DIAGNOSIS — Z8541 Personal history of malignant neoplasm of cervix uteri: Secondary | ICD-10-CM | POA: Insufficient documentation

## 2022-04-19 DIAGNOSIS — Z923 Personal history of irradiation: Secondary | ICD-10-CM | POA: Diagnosis not present

## 2022-04-19 HISTORY — PX: COLONOSCOPY WITH PROPOFOL: SHX5780

## 2022-04-19 SURGERY — COLONOSCOPY WITH PROPOFOL
Anesthesia: General

## 2022-04-19 MED ORDER — DEXMEDETOMIDINE HCL 200 MCG/2ML IV SOLN
INTRAVENOUS | Status: DC | PRN
  Administered 2022-04-19: 12 ug via INTRAVENOUS

## 2022-04-19 MED ORDER — SODIUM CHLORIDE 0.9 % IV SOLN: INTRAVENOUS | Status: DC

## 2022-04-19 MED ORDER — PROPOFOL 10 MG/ML IV BOLUS
INTRAVENOUS | Status: DC | PRN
  Administered 2022-04-19: 40 mg via INTRAVENOUS
  Administered 2022-04-19: 50 mg via INTRAVENOUS

## 2022-04-19 MED ORDER — LIDOCAINE HCL (CARDIAC) PF 100 MG/5ML IV SOSY
PREFILLED_SYRINGE | INTRAVENOUS | Status: DC | PRN
  Administered 2022-04-19: 100 mg via INTRAVENOUS

## 2022-04-19 MED ORDER — PROPOFOL 500 MG/50ML IV EMUL
INTRAVENOUS | Status: DC | PRN
  Administered 2022-04-19: 200 ug/kg/min via INTRAVENOUS

## 2022-04-19 MED ORDER — STERILE WATER FOR IRRIGATION IR SOLN
Status: DC | PRN
  Administered 2022-04-19: 60 mL

## 2022-04-19 NOTE — Anesthesia Postprocedure Evaluation (Signed)
Anesthesia Post Note  Patient: Kathryn Robbins  Procedure(s) Performed: COLONOSCOPY WITH PROPOFOL  Patient location during evaluation: PACU Anesthesia Type: General Level of consciousness: awake Pain management: satisfactory to patient Vital Signs Assessment: post-procedure vital signs reviewed and stable Respiratory status: nonlabored ventilation and respiratory function stable Cardiovascular status: stable Anesthetic complications: no   No notable events documented.   Last Vitals:  Vitals:   04/19/22 1028 04/19/22 1038  BP: 119/72 140/72  Pulse:    Resp:    Temp:    SpO2:      Last Pain:  Vitals:   04/19/22 1038  TempSrc:   PainSc: 0-No pain                 VAN STAVEREN,Lorry Furber

## 2022-04-19 NOTE — Transfer of Care (Signed)
Immediate Anesthesia Transfer of Care Note  Patient: Kathryn Robbins  Procedure(s) Performed: COLONOSCOPY WITH PROPOFOL  Patient Location: Endoscopy Unit  Anesthesia Type:General  Level of Consciousness: drowsy  Airway & Oxygen Therapy: Patient Spontanous Breathing  Post-op Assessment: Report given to RN and Post -op Vital signs reviewed and stable  Post vital signs: Reviewed and stable  Last Vitals:  Vitals Value Taken Time  BP 98/48 04/19/22 1018  Temp    Pulse 63 04/19/22 1019  Resp 18 04/19/22 1019  SpO2 100 % 04/19/22 1019  Vitals shown include unvalidated device data.  Last Pain:  Vitals:   04/19/22 1018  TempSrc:   PainSc: Asleep         Complications: No notable events documented.

## 2022-04-19 NOTE — Anesthesia Preprocedure Evaluation (Signed)
Anesthesia Evaluation  Patient identified by MRN, date of birth, ID band Patient awake    Reviewed: Allergy & Precautions, NPO status , Patient's Chart, lab work & pertinent test results  Airway Mallampati: II  TM Distance: >3 FB Neck ROM: Full    Dental  (+) Teeth Intact   Pulmonary neg pulmonary ROS,    Pulmonary exam normal breath sounds clear to auscultation       Cardiovascular Exercise Tolerance: Good hypertension, Pt. on medications negative cardio ROS Normal cardiovascular exam Rhythm:Regular     Neuro/Psych negative neurological ROS  negative psych ROS   GI/Hepatic negative GI ROS, Neg liver ROS,   Endo/Other  negative endocrine ROS  Renal/GU negative Renal ROS  negative genitourinary   Musculoskeletal negative musculoskeletal ROS (+)   Abdominal Normal abdominal exam  (+)   Peds negative pediatric ROS (+)  Hematology negative hematology ROS (+)   Anesthesia Other Findings Past Medical History: 2022: Cancer ([place])     Comment:  Cervical No date: History of radiation therapy No date: Hypertension No date: Personal history of chemotherapy  Past Surgical History: 08/2019: HYSTEROSCOPY     Comment:  with cervical conization and D&C 09/2019: INSERTION CENTRAL VENOUS ACCESS DEVICE W/ SUBCUTANEOUS PORT No date: PORT-A-CATH REMOVAL     Comment:  2021     Reproductive/Obstetrics negative OB ROS                            Anesthesia Physical Anesthesia Plan  ASA: 2  Anesthesia Plan: General   Post-op Pain Management:    Induction: Intravenous  PONV Risk Score and Plan: Propofol infusion and TIVA  Airway Management Planned:   Additional Equipment:   Intra-op Plan:   Post-operative Plan:   Informed Consent: I have reviewed the patients History and Physical, chart, labs and discussed the procedure including the risks, benefits and alternatives for the proposed  anesthesia with the patient or authorized representative who has indicated his/her understanding and acceptance.     Dental Advisory Given  Plan Discussed with: CRNA and Surgeon  Anesthesia Plan Comments:         Anesthesia Quick Evaluation

## 2022-04-19 NOTE — Anesthesia Postprocedure Evaluation (Signed)
Anesthesia Post Note  Patient: Aleeyah Strandberg  Procedure(s) Performed: COLONOSCOPY WITH PROPOFOL  Patient location during evaluation: PACU Anesthesia Type: General Level of consciousness: awake and oriented Pain management: pain level controlled Respiratory status: spontaneous breathing and respiratory function stable Cardiovascular status: stable Anesthetic complications: no   No notable events documented.   Last Vitals:  Vitals:   04/19/22 1028 04/19/22 1038  BP: 119/72 140/72  Pulse:    Resp:    Temp:    SpO2:      Last Pain:  Vitals:   04/19/22 1038  TempSrc:   PainSc: 0-No pain                 VAN STAVEREN,Warren Lindahl

## 2022-04-19 NOTE — H&P (Signed)
Jonathon Bellows, MD [address] [place], [place], [place], [address] [address], [place], [place], [place], [address] Phone: [phone]  Fax: [phone]  Primary Care Physician:  Mylinda Latina, PA-C   Pre-Procedure History & Physical: HPI:  Kathryn Robbins is a 56 y.o. female is here for an colonoscopy.   Past Medical History:  Diagnosis Date   Cancer ([place]) 2022   Cervical   History of radiation therapy    Hypertension    Personal history of chemotherapy     Past Surgical History:  Procedure Laterality Date   HYSTEROSCOPY  08/2019   with cervical conization and D&C   INSERTION CENTRAL VENOUS ACCESS DEVICE W/ SUBCUTANEOUS PORT  09/2019   PORT-A-CATH REMOVAL     2021    Prior to Admission medications   Medication Sig Start Date End Date Taking? Authorizing Provider  losartan (COZAAR) 50 MG tablet Take by mouth. 02/25/21  Yes [provider]  rosuvastatin (CRESTOR) 5 MG tablet Take 1 tablet by mouth at night twice per week 03/03/22  Yes McDonough, Lauren K, PA-C    Allergies as of 04/05/2022 - Review Complete 04/05/2022  Allergen Reaction Noted   Cefadroxil Itching 06/11/2019   Keflex [cephalexin] Rash 02/03/2022    Family History  Problem Relation Age of Onset   Liver cancer Mother    Breast cancer Neg Hx    Colon cancer Neg Hx    Ovarian cancer Neg Hx    Endometrial cancer Neg Hx    Pancreatic cancer Neg Hx    Prostate cancer Neg Hx     Social History   Socioeconomic History   Marital status: Married    Spouse name: Not on file   Number of children: Not on file   Years of education: Not on file   Highest education level: Not on file  Occupational History   Occupation: homemaker  Tobacco Use   Smoking status: Never   Smokeless tobacco: Never  Vaping Use   Vaping Use: Never used  Substance and Sexual Activity   Alcohol use: Never   Drug use: Never   Sexual activity: Not Currently  Other Topics Concern   Not on file   Social History Narrative   Not on file   Social Determinants of Health   Financial Resource Strain: Not on file  Food Insecurity: Not on file  Transportation Needs: Not on file  Physical Activity: Not on file  Stress: Not on file  Social Connections: Not on file  Intimate Partner Violence: Not on file    Review of Systems: See HPI, otherwise negative ROS  Physical Exam: BP (!) 159/72   Pulse 73   Temp (!) 96.7 F (35.9 C) (Temporal)   Resp 20   Ht '5\' 1"'$  (1.549 m)   Wt 50.3 kg   SpO2 100%   BMI 20.97 kg/m  General:   Alert,  pleasant and cooperative in NAD Head:  Normocephalic and atraumatic. Neck:  Supple; no masses or thyromegaly. Lungs:  Clear throughout to auscultation, normal respiratory effort.    Heart:  +S1, +S2, Regular rate and rhythm, No edema. Abdomen:  Soft, nontender and nondistended. Normal bowel sounds, without guarding, and without rebound.   Neurologic:  Alert and  oriented x4;  grossly normal neurologically.  Impression/Plan: Kathryn Robbins is here for an colonoscopy to be performed for Screening colonoscopy average risk   Risks, benefits, limitations, and alternatives regarding  colonoscopy have been reviewed with the patient.  Questions have been answered.  All parties agreeable.   Jonathon Bellows, MD  04/19/2022, 9:59 AM

## 2022-04-19 NOTE — Op Note (Signed)
[facility] Gastroenterology Patient Name: Kathryn Robbins Procedure Date: 04/19/2022 10:00 AM MRN: [id number] Account #: [id number] Date of Birth: [birth year] Admit Type: Outpatient Age: 56 Room: [facility] ENDO ROOM 2 Gender: Female Note Status: Finalized Instrument Name: Jasper Riling [id number] Procedure:             Colonoscopy Indications:           Screening for colorectal malignant neoplasm Providers:             Jonathon Bellows MD, MD Medicines:             Monitored Anesthesia Care Complications:         No immediate complications. Procedure:             Pre-Anesthesia Assessment:                        - Prior to the procedure, a History and Physical was                         performed, and patient medications, allergies and                         sensitivities were reviewed. The patient's tolerance                         of previous anesthesia was reviewed.                        - The risks and benefits of the procedure and the                         sedation options and risks were discussed with the                         patient. All questions were answered and informed                         consent was obtained.                        - ASA Grade Assessment: II - A patient with mild                         systemic disease.                        After obtaining informed consent, the colonoscope was                         passed under direct vision. Throughout the procedure,                         the patient's blood pressure, pulse, and oxygen                         saturations were monitored continuously. The                         Colonoscope was introduced through the anus and  advanced to the the cecum, identified by the                         appendiceal orifice. The colonoscopy was performed                         with ease. The patient tolerated the procedure well.                         The quality of the bowel  preparation was excellent. Findings:      The perianal and digital rectal examinations were normal.      The mucosa vascular pattern in the rectum was locally increased.      The exam was otherwise without abnormality on direct and retroflexion       views. Impression:            - Increased mucosa vascular pattern in the rectum.                        - The examination was otherwise normal on direct and                         retroflexion views.                        - No specimens collected. Recommendation:        - Discharge patient to home (with escort).                        - Resume previous diet.                        - Continue present medications.                        - Repeat colonoscopy in 10 years for screening                         purposes. Procedure Code(s):     --- Professional ---                        [phone], Colonoscopy, flexible; diagnostic, including                         collection of specimen(s) by brushing or washing, when                         performed (separate procedure) Diagnosis Code(s):     --- Professional ---                        Z12.11, Encounter for screening for malignant neoplasm                         of colon CPT copyright 2019 American Medical Association. All rights reserved. The codes documented in this report are preliminary and upon coder review may  be revised to meet current compliance requirements. Jonathon Bellows, MD Jonathon Bellows MD, MD 04/19/2022 10:19:27 AM This report has been signed electronically. Number of Addenda: 0 Note Initiated On: 04/19/2022 10:00  AM Scope Withdrawal Time: 0 hours 6 minutes 11 seconds  Total Procedure Duration: 0 hours 10 minutes 10 seconds  Estimated Blood Loss:  Estimated blood loss: none.      [facility]

## 2022-04-20 ENCOUNTER — Encounter: Payer: Self-pay | Admitting: Gastroenterology

## 2022-04-22 ENCOUNTER — Ambulatory Visit
Admission: RE | Admit: 2022-04-22 | Discharge: 2022-04-22 | Disposition: A | Discharge status: Home / Self Care | Payer: 59 | Source: Ambulatory Visit | Attending: Gynecologic Oncology | Admitting: Gynecologic Oncology

## 2022-04-22 DIAGNOSIS — Z8541 Personal history of malignant neoplasm of cervix uteri: Secondary | ICD-10-CM | POA: Diagnosis not present

## 2022-04-22 MED ORDER — IOHEXOL 350 MG/ML SOLN
75.0000 mL | Freq: Once | INTRAVENOUS | Status: AC | PRN
  Administered 2022-04-22: 75 mL via INTRAVENOUS

## 2022-04-25 ENCOUNTER — Telehealth: Payer: Self-pay

## 2022-05-16 ENCOUNTER — Ambulatory Visit: Payer: 59

## 2022-05-16 DIAGNOSIS — R0989 Other specified symptoms and signs involving the circulatory and respiratory systems: Secondary | ICD-10-CM

## 2022-05-26 ENCOUNTER — Ambulatory Visit (INDEPENDENT_AMBULATORY_CARE_PROVIDER_SITE_OTHER): Payer: 59 | Admitting: Physician Assistant

## 2022-05-26 ENCOUNTER — Encounter: Payer: Self-pay | Admitting: Physician Assistant

## 2022-05-26 VITALS — BP 148/80 | HR 63 | Temp 97.8°F | Resp 16 | Ht 61.0 in | Wt 111.0 lb

## 2022-05-26 DIAGNOSIS — E782 Mixed hyperlipidemia: Secondary | ICD-10-CM

## 2022-05-26 DIAGNOSIS — Z8541 Personal history of malignant neoplasm of cervix uteri: Secondary | ICD-10-CM | POA: Diagnosis not present

## 2022-05-26 DIAGNOSIS — E278 Other specified disorders of adrenal gland: Secondary | ICD-10-CM

## 2022-05-26 DIAGNOSIS — I10 Essential (primary) hypertension: Secondary | ICD-10-CM | POA: Diagnosis not present

## 2022-05-26 DIAGNOSIS — I700 Atherosclerosis of aorta: Secondary | ICD-10-CM | POA: Diagnosis not present

## 2022-05-26 MED ORDER — LOSARTAN POTASSIUM 50 MG PO TABS: 50.0000 mg | ORAL_TABLET | Freq: Every day | ORAL | 3 refills | Status: DC

## 2022-05-26 MED ORDER — ROSUVASTATIN CALCIUM 5 MG PO TABS: ORAL_TABLET | ORAL | 3 refills | Status: DC

## 2022-05-26 NOTE — Progress Notes (Signed)
[place], [place] [id number]  Internal MEDICINE  Office Visit Note  Patient Name: Kathryn Robbins  [id number]  [id number]  Date of Service: 06/06/2022  Chief Complaint  Patient presents with   Follow-up    Review ultrasound   Hypertension   Vaginal Bleeding    HPI Pt is here for routine follow up to review carotid US -carotid US showed no significant stenosis on either side, mild plaque seen. Is tolerating crestor -2 days spotting after lifting something but hasnt continued. Will follow up with OBGYN -Is followed by GYN-[facility] and had recent CT for surveillance. This CT did show Aortic atherosclerosis and she is already on crestor. It also showed 22m left adrenal nodule and recommends comparison with previous imaging and if new or changing to pursue additional imaging. Will follow up with GYN-ON[facility] and will look to see if prior imaging that can be reviewed. If needed will order additional imaging -BP stable at home, always elevated in office. Home cuff checked in office last visit and was comparable therefore likely white coat syndrome.  Current Medication: Outpatient Encounter Medications as of 05/26/2022  Medication Sig   [DISCONTINUED] losartan (COZAAR) 50 MG tablet Take by mouth.   [DISCONTINUED] rosuvastatin (CRESTOR) 5 MG tablet Take 1 tablet by mouth at night twice per week   losartan (COZAAR) 50 MG tablet Take 1 tablet (50 mg total) by mouth daily.   rosuvastatin (CRESTOR) 5 MG tablet Take 1 tablet by mouth at night twice per week   No facility-administered encounter medications on file as of 05/26/2022.    Surgical History: Past Surgical History:  Procedure Laterality Date   COLONOSCOPY WITH PROPOFOL N/A 04/19/2022   Procedure: COLONOSCOPY WITH PROPOFOL;  Surgeon: AJonathon Bellows MD;  [facility]  Service: Gastroenterology;  Laterality: N/A;   HYSTEROSCOPY  08/2019   with cervical conization and D&C   INSERTION CENTRAL  VENOUS ACCESS DEVICE W/ SUBCUTANEOUS PORT  09/2019   PORT-A-CATH REMOVAL     2021    Medical History: Past Medical History:  Diagnosis Date   Cancer ([place] 2022   Cervical   History of radiation therapy    Hypertension    Personal history of chemotherapy     Family History: Family History  Problem Relation Age of Onset   Liver cancer Mother    Breast cancer Neg Hx    Colon cancer Neg Hx    Ovarian cancer Neg Hx    Endometrial cancer Neg Hx    Pancreatic cancer Neg Hx    Prostate cancer Neg Hx     Social History   Socioeconomic History   Marital status: Married    Spouse name: Not on file   Number of children: Not on file   Years of education: Not on file   Highest education level: Not on file  Occupational History   Occupation: homemaker  Tobacco Use   Smoking status: Never   Smokeless tobacco: Never  Vaping Use   Vaping Use: Never used  Substance and Sexual Activity   Alcohol use: Never   Drug use: Never   Sexual activity: Not Currently  Other Topics Concern   Not on file  Social History Narrative   Not on file   Social Determinants of Health   Financial Resource Strain: Not on file  Food Insecurity: Not on file  Transportation Needs: Not on file  Physical Activity: Not on file  Stress: Not on file  Social Connections: Not on  file  Intimate Partner Violence: Not on file      Review of Systems  Constitutional:  Negative for chills, fatigue and unexpected weight change.  HENT:  Negative for congestion, rhinorrhea, sneezing and sore throat.   Eyes:  Negative for redness.  Respiratory:  Negative for cough, chest tightness and shortness of breath.   Cardiovascular:  Negative for chest pain and palpitations.  Gastrointestinal:  Negative for abdominal pain, constipation, diarrhea, nausea and vomiting.  Genitourinary:  Positive for vaginal bleeding. Negative for dysuria, frequency and vaginal discharge.  Musculoskeletal:  Negative for arthralgias, back  pain, joint swelling and neck pain.  Skin:  Negative for rash.  Neurological: Negative.  Negative for tremors and numbness.  Hematological:  Negative for adenopathy. Does not bruise/bleed easily.  Psychiatric/Behavioral:  Negative for behavioral problems (Depression), sleep disturbance and suicidal ideas. The patient is nervous/anxious.     Vital Signs: BP (!) 148/80 Comment: 153/81  Pulse 63   Temp 97.8 F (36.6 C)   Resp 16   Ht '5\' 1"'$  (1.549 m)   Wt 111 lb (50.3 kg)   SpO2 99%   BMI 20.97 kg/m    Physical Exam Vitals and nursing note reviewed.  Constitutional:      General: She is not in acute distress.    Appearance: Normal appearance. She is well-developed and normal weight. She is not diaphoretic.  HENT:     Head: Normocephalic and atraumatic.     Mouth/Throat:     Pharynx: No oropharyngeal exudate.  Eyes:     Pupils: Pupils are equal, round, and reactive to light.  Neck:     Thyroid: No thyromegaly.     Vascular: No JVD.     Trachea: No tracheal deviation.  Cardiovascular:     Rate and Rhythm: Normal rate and regular rhythm.     Heart sounds: Normal heart sounds. No murmur heard.    No friction rub. No gallop.  Pulmonary:     Effort: Pulmonary effort is normal. No respiratory distress.     Breath sounds: No wheezing or rales.  Chest:     Chest wall: No tenderness.  Abdominal:     General: Bowel sounds are normal.     Palpations: Abdomen is soft.  Musculoskeletal:        General: Normal range of motion.     Cervical back: Normal range of motion and neck supple.  Lymphadenopathy:     Cervical: No cervical adenopathy.  Skin:    General: Skin is warm and dry.  Neurological:     Mental Status: She is alert and oriented to person, place, and time.     Cranial Nerves: No cranial nerve deficit.  Psychiatric:        Behavior: Behavior normal.        Thought Content: Thought content normal.        Judgment: Judgment normal.        Assessment/Plan: 1.  Essential hypertension Pt has white coat syndrome, BP well controlled at home and will continue with current medication - losartan (COZAAR) 50 MG tablet; Take 1 tablet (50 mg total) by mouth daily.  Dispense: 90 tablet; Refill: 3  2. History of cervical cancer Followed by GYN[facility] and will follow up regarding recent vaginal spotting  3. Mixed hyperlipidemia - rosuvastatin (CRESTOR) 5 MG tablet; Take 1 tablet by mouth at night twice per week  Dispense: 30 tablet; Refill: 3  4. Aortic atherosclerosis ([place]) Continue crestor  5. Adrenal nodule ([place])  Will need to monitor and see if prior imaging available for comparison. May need further imaging although pt is also under routine imaging surveillance for hx of cervical cancer already   General Counseling: Emera verbalizes understanding of the findings of todays visit and agrees with plan of treatment. I have discussed any further diagnostic evaluation that may be needed or ordered today. We also reviewed her medications today. she has been encouraged to call the office with any questions or concerns that should arise related to todays visit.    No orders of the defined types were placed in this encounter.   Meds ordered this encounter  Medications   losartan (COZAAR) 50 MG tablet    Sig: Take 1 tablet (50 mg total) by mouth daily.    Dispense:  90 tablet    Refill:  3   rosuvastatin (CRESTOR) 5 MG tablet    Sig: Take 1 tablet by mouth at night twice per week    Dispense:  30 tablet    Refill:  3    This patient was seen by Drema Dallas, PA-C in collaboration with Dr. Clayborn Bigness as a part of collaborative care agreement.   Total time spent:30 Minutes Time spent includes review of chart, medications, test results, and follow up plan with the patient.      Dr Lavera Guise Internal medicine

## 2022-05-30 ENCOUNTER — Encounter: Payer: Self-pay | Admitting: Gynecologic Oncology

## 2022-05-30 ENCOUNTER — Telehealth: Payer: Self-pay

## 2022-05-30 NOTE — Telephone Encounter (Signed)
Pt's son, Kathryn Robbins, called this morning for his mom. He states pt is concerned with red vaginal spotting, on a panty liner, which started on Friiday 7/28 and continues. Not able to measure. The spotting comes and goes with minimal pain 2-3/10 on pain scale. Nothing has been inserted in the vagina, and no intercourse.  The son states she has a picture, he will try to send it through her MyChart.  Per her son,  Kathryn Robbins is very concerned and would like to be seen.    Kathryn Robbins aware I will notify Dr.Tucker and will get back to him. He voiced an understanding.

## 2022-05-30 NOTE — Telephone Encounter (Signed)
Pt's son will bring pt tomorrow 8/1 @ 11:30 to see [name] NP

## 2022-05-31 ENCOUNTER — Other Ambulatory Visit: Payer: Self-pay

## 2022-05-31 ENCOUNTER — Inpatient Hospital Stay: Payer: BLUE CROSS/BLUE SHIELD | Attending: Gynecologic Oncology | Admitting: Gynecologic Oncology

## 2022-05-31 ENCOUNTER — Inpatient Hospital Stay: Payer: 59

## 2022-05-31 ENCOUNTER — Telehealth: Payer: Self-pay

## 2022-05-31 VITALS — BP 142/76 | HR 68 | Resp 16 | Ht 61.0 in | Wt 110.0 lb

## 2022-05-31 DIAGNOSIS — Z923 Personal history of irradiation: Secondary | ICD-10-CM | POA: Insufficient documentation

## 2022-05-31 DIAGNOSIS — R300 Dysuria: Secondary | ICD-10-CM | POA: Diagnosis not present

## 2022-05-31 DIAGNOSIS — Z8541 Personal history of malignant neoplasm of cervix uteri: Secondary | ICD-10-CM | POA: Diagnosis not present

## 2022-05-31 DIAGNOSIS — Z9221 Personal history of antineoplastic chemotherapy: Secondary | ICD-10-CM | POA: Diagnosis not present

## 2022-05-31 LAB — URINALYSIS, COMPLETE (UACMP) WITH MICROSCOPIC
Bacteria, UA: NONE SEEN
Bilirubin Urine: NEGATIVE
Glucose, UA: NEGATIVE mg/dL
Ketones, ur: NEGATIVE mg/dL
Leukocytes,Ua: NEGATIVE
Nitrite: NEGATIVE
Protein, ur: NEGATIVE mg/dL
Specific Gravity, Urine: 1.011 (ref 1.005–1.030)
pH: 6 (ref 5.0–8.0)

## 2022-05-31 NOTE — Patient Instructions (Addendum)
No evidence of cancer on today's exam. There is a small abrasion or skin tear under the hole where the urine comes out. We recommend using a barrier cream to protect this area while this heals. You can use the peri bottle at the same time you are urinating to rinse the urine away from the skin and pat the area dry. Once the skin is dry, you can apply vaseline or a barrier cream to help protect the area and allow for healing.   We will test your urine today to check for an infection and we will let you know the results.   Please call the office for any new symptoms or concerns. Plan to follow up as planned with Kathryn Robbins in December 2023 or sooner if needed.

## 2022-05-31 NOTE — Telephone Encounter (Signed)
Per [name] NP. Urinalysis from today was negative for infection. We will call her with results of the culture once it is resulted.   Pt's son is aware and will notify his mom.

## 2022-05-31 NOTE — Progress Notes (Unsigned)
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 Her last office check up with Dr. Berline Lopes was on 04/11/22 with NED on exam. She underwent a CT scan on 04/23/22 as a surveillance exam with no acute intra-abdominal pathology identified, no evidence of recurrent or residual disease within the abdomen and pelvis, indeterminate left adrenal nodule.  She presents to the office today for evaluation of vaginal/vulvar spotting. She reports noting the light pink/red spotting on her peri-pad the day before yesterday and one other time on July 21st. She denies bleeding at this time. She is feeling well otherwise. Tolerating diet with no nausea or emesis. No fever or chills. Reports burning with urination. Having normal bowel movements. Reporting minor lower pelvic discomfort that she has experienced 2-3 times and describes as minor. Denies recent intercourse or having anything inserted in the vagina. Interpreter used virtually.   Exam: Alert, oriented, no acute distress. Lungs clear. Heart regular in rate and rhythm. Abdomen soft, active bowel sounds, non-tender on palpation. To the left of the urethra, a minor, non-bleeding, abrasion noted. No lower extrem edema noted bilaterally.

## 2022-06-01 LAB — URINE CULTURE: Culture: <10000 — AB

## 2022-06-02 NOTE — Telephone Encounter (Signed)
I spoke to Kathryn Robbins's son, Nishit, he will relay the message to his mom regarding the urine culture being negative. Per [name] NP,  The burning she is experiencing is related to the abrasion near the urethra. Continue using the peri bottle and keep using a barrier cream (vaseline) on the area.  He voiced an understanding and will get her to call if she has any other concerns.

## 2022-06-09 ENCOUNTER — Other Ambulatory Visit: Payer: Self-pay | Admitting: Gynecologic Oncology

## 2022-06-09 ENCOUNTER — Inpatient Hospital Stay: Payer: 59 | Admitting: Gynecologic Oncology

## 2022-06-09 ENCOUNTER — Encounter: Payer: Self-pay | Admitting: Gynecologic Oncology

## 2022-06-09 ENCOUNTER — Telehealth: Payer: Self-pay

## 2022-06-09 DIAGNOSIS — C531 Malignant neoplasm of exocervix: Secondary | ICD-10-CM

## 2022-06-09 DIAGNOSIS — N952 Postmenopausal atrophic vaginitis: Secondary | ICD-10-CM

## 2022-06-09 DIAGNOSIS — N905 Atrophy of vulva: Secondary | ICD-10-CM

## 2022-06-09 MED ORDER — ESTRADIOL 0.1 MG/GM VA CREA: TOPICAL_CREAM | VAGINAL | 3 refills | Status: DC

## 2022-06-09 NOTE — Progress Notes (Signed)
See RN note. Per Dr. Berline Lopes, plan to begin vaginal/vulvar estrogen to assist with symptoms related to radiation changes, vaginal/vulvar atrophy.

## 2022-06-09 NOTE — Telephone Encounter (Signed)
Told Kathryn Robbins that at her recent exam, there were not concerning findings.  Dr. Berline Robbins would like for her to start using vaginal estrogen cream which is prescription. This cream is to be applied on the outside vulvar tissue and at the entrance of the vagina at bedtime. This is to be applied nightly for 2 weeks that then three times a week.  This cream will help to repair the tissue that is thin and friable related to age and radiation.  Requested that she call the office in 2 weeks or sooner with an update on her symptoms.  Appointment for today will be cancelled. Pt verbalized understanding.

## 2022-06-09 NOTE — Procedures (Signed)
[place], [place] [id number]  DATE OF SERVICE: May 16, 2022  CAROTID DOPPLER INTERPRETATION:  Bilateral Carotid Ultrsasound and Color Doppler Examination was performed. The RIGHT CCA shows minimal plaque in the vessel. The LEFT CCA shows minimal plaque in the vessel. There was no significant intimal thickening noted in the RIGHT carotid artery. There was no significant intimal thickening in the LEFT carotid artery.  The RIGHT CCA shows peak systolic velocity of 76 cm per second. The end diastolic velocity is 20 cm per second on the RIGHT side. The RIGHT ICA shows peak systolic velocity of 80 per second. RIGHT sided ICA end diastolic velocity is 28 cm per second. The RIGHT ECA shows a peak systolic velocity of 81 cm per second. The ICA/CCA ratio is calculated to be 1.0. This suggests less than 50% stenosis. The Vertebral Artery shows antegrade flow.  The LEFT CCA shows peak systolic velocity of 84 cm per second. The end diastolic velocity is 25 cm per second on the LEFT side. The LEFT ICA shows peak systolic velocity of 92 per second. LEFT sided ICA end diastolic velocity is 30 cm per second. The LEFT ECA shows a peak systolic velocity of 94 cm per second. The ICA/CCA ratio is calculated to be 1.0. This suggests less than 50% stenosis. The Vertebral Artery shows antegrade flow.   Impression:    The RIGHT CAROTID shows less than 50% stenosis. The LEFT CAROTID shows less than 50% stenosis.  There is minimal plaque formation noted on the LEFT and minimal plaque on the RIGHT  side. Consider a repeat Carotid doppler if clinical situation and symptoms warrant in 6-12 months. Patient should be encouraged to change lifestyles such as smoking cessation, regular exercise and dietary modification. Use of statins in the right clinical setting and ASA is encouraged.  Allyne Gee, MD [facility] Pulmonary Critical Care Medicine

## 2022-07-13 ENCOUNTER — Telehealth: Payer: Self-pay

## 2022-07-13 NOTE — Telephone Encounter (Signed)
I spoke to Nishit, pt's son, who is interpreting for his mom. He states pt is using the Estrogen vaginal cream 2x a week. Notified him, per [name] NP, she needs to increase to using it 3x a week. Appointment made for pt to see Dr.Jackson-Moore on 9/27 @ 11:15. Date/time agreed by pt and her son.

## 2022-07-13 NOTE — Telephone Encounter (Signed)
Pt sent a Mychart message to [name] NP.  Left voicemail via [facility] ID# [phone]

## 2022-07-27 ENCOUNTER — Encounter: Payer: Self-pay | Admitting: Obstetrics & Gynecology

## 2022-07-27 ENCOUNTER — Other Ambulatory Visit: Payer: Self-pay

## 2022-07-27 ENCOUNTER — Inpatient Hospital Stay: Payer: BLUE CROSS/BLUE SHIELD | Attending: Gynecologic Oncology | Admitting: Obstetrics & Gynecology

## 2022-07-27 VITALS — BP 157/73 | HR 70 | Temp 98.3°F | Resp 16 | Ht 60.95 in | Wt 109.8 lb

## 2022-07-27 DIAGNOSIS — C531 Malignant neoplasm of exocervix: Secondary | ICD-10-CM

## 2022-07-27 DIAGNOSIS — Z923 Personal history of irradiation: Secondary | ICD-10-CM | POA: Diagnosis not present

## 2022-07-27 DIAGNOSIS — Z8541 Personal history of malignant neoplasm of cervix uteri: Secondary | ICD-10-CM | POA: Diagnosis not present

## 2022-07-27 DIAGNOSIS — N905 Atrophy of vulva: Secondary | ICD-10-CM

## 2022-07-27 DIAGNOSIS — Z809 Family history of malignant neoplasm, unspecified: Secondary | ICD-10-CM | POA: Insufficient documentation

## 2022-07-27 DIAGNOSIS — Z9221 Personal history of antineoplastic chemotherapy: Secondary | ICD-10-CM | POA: Diagnosis not present

## 2022-07-27 NOTE — Progress Notes (Signed)
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 Biopsies on 07/02/2019: EGD shows at least CIN-3, invasive basal carcinoma cannot be excluded.  Endometrial biopsy shows at least CIN-3.  Endometrium noted.  P16 staining is positive. On exam in late October, 2020, vaginal involvement was noted. Biopsies on 08/16/2019: D&C shows moderately to poorly differentiated keratinizing squamous cell carcinoma.  Normal endometrial mucosa not identified.  Endocervical curettage shows at least fragments of malignant/dysplastic squamous cells.  Cervical biopsy shows fragments of squamous mucosa with foci of invasive moderately differentiated squamous cell carcinoma.  LEEP shows invasive moderately to poorly differentiated squamous cell carcinoma, extensive.  Inked margins positive for tumor.  Posterior cervix, LEEP: Again show squamous cell carcinoma, tumor involves all fragments extensively. PET scan on 08/30/2019: Intense FDG metabolism of the cervix with mildly increased metabolism within the vaginal vault.  No distant metastatic disease. Radiation treatment summary: 18 Gray to the cervix and pelvic lymph nodes, 26 fractions, dose per fraction 1.8 Gray.  Patient then received HDR with T&O, total dose of 30 Gray, 6 fractions. She completed radiation on 11/21/2019.   She was seen by radiation oncology on 03/05/21 and was NED at that time on exam. CT C/A/P from 03/03/21 revealed no metastatic disease. She was seen again in 08/2021 and continued to be NED at that time, some vaginal stenosis. CT C/A/P 08/31/21 revealed no evidence of disease. The patient reported moving from [place] in November last year to join family in the area.    Seen by Dr. Berline Lopes was on 04/11/22 with NED on  exam. She underwent a CT scan on 04/23/22 as a surveillance exam with no acute intra-abdominal pathology identified, no evidence of recurrent or residual disease within the abdomen and pelvis, indeterminate left adrenal nodule. Left adrenal nodularity seen on previous scans from [place].   Interval History: She presents to the office today for a f/u visit.  She was treated w/vaginal E2 at the last visit.  Bleeding stopped 1 week ago. Less urgency since starting the vaginal E2.  The interview and exam were conducted w/the assistance of a virtual interpreter.     Review of Systems: Additional 10 point review of systems negative  Current Meds:  Outpatient Encounter Medications as of 07/27/2022  Medication Sig   estradiol (ESTRACE VAGINAL) 0.1 MG/GM vaginal cream Apply dime size amount on finger and apply to the vulva and at entrance of the vagina nightly for 2 weeks then three times weekly   losartan (COZAAR) 50 MG tablet Take 1 tablet (50 mg total) by mouth daily.   rosuvastatin (CRESTOR) 5 MG tablet Take 1 tablet by mouth at night twice per week   No facility-administered encounter medications on file as of 07/27/2022.    Allergy:  Allergies  Allergen Reactions   Cefadroxil Itching   Keflex [Cephalexin] Rash    Social Hx:   Social History   Socioeconomic History   Marital status: Married    Spouse name: Not on file   Number of children: Not on file   Years of education: Not on file   Highest education level: Not on file  Occupational History   Occupation: homemaker  Tobacco Use   Smoking status: Never   Smokeless tobacco: Never  Vaping Use  Vaping Use: Never used  Substance and Sexual Activity   Alcohol use: Never   Drug use: Never   Sexual activity: Not Currently  Other Topics Concern   Not on file  Social History Narrative   Not on file   Social Determinants of Health   Financial Resource Strain: Not on file  Food Insecurity: Not on file  Transportation Needs: Not  on file  Physical Activity: Not on file  Stress: Not on file  Social Connections: Not on file  Intimate Partner Violence: Not on file    Past Surgical Hx:  Past Surgical History:  Procedure Laterality Date   COLONOSCOPY WITH PROPOFOL N/A 04/19/2022   Procedure: COLONOSCOPY WITH PROPOFOL;  Surgeon: Jonathon Bellows, MD;  Location: [facility] ENDOSCOPY;  Service: Gastroenterology;  Laterality: N/A;   HYSTEROSCOPY  08/2019   with cervical conization and D&C   INSERTION CENTRAL VENOUS ACCESS DEVICE W/ SUBCUTANEOUS PORT  09/2019   PORT-A-CATH REMOVAL     2021    Past Medical Hx:  Past Medical History:  Diagnosis Date   Cancer ([place]) 2022   Cervical   History of radiation therapy    Hypertension    Personal history of chemotherapy     Family Hx:  Family History  Problem Relation Age of Onset   Liver cancer Mother    Breast cancer Neg Hx    Colon cancer Neg Hx    Ovarian cancer Neg Hx    Endometrial cancer Neg Hx    Pancreatic cancer Neg Hx    Prostate cancer Neg Hx     Vitals:  There were no vitals taken for this visit.  Exam: Alert, oriented, no acute distress. Pelvic: External: small, focal erythematous area (3 mm) --periurethral.  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 Symptomatic GSM being treated w/vaginal E2.   No further episodes.  Counseled re: use moisturizers.  She is advised to call for any persistent and/or new symptoms and to follow up as planned in December 2023 with Dr. Berline Lopes or sooner if needed.   I personally spent 25 minutes face-to-face and non-face-to-face in the care of this patient, which includes all pre, intra, and post visit time on the date of service.    Lahoma Crocker, MD 07/27/2022, 9:05 AM

## 2022-07-27 NOTE — Patient Instructions (Addendum)
Return in 12/23 w/Dr. Berline Lopes.  Vulvar/Vaginal Moisturizers  Moisturizer Options: Vitamin E oil: pump or capsule form Vitamin E cream (Gene's vitamin E cream) Coconut oil: bottle or bead form Shea butter Blossom Organic Lubricant (organic and all natural; www.blossomorganics.com) PE suppository(coconut oil/vitamin E/palm oil) Desert Harvest Aloe Glide      Consider the ingredients of the product - the fewer the ingredients the better!  Directions for Use: Clean and dry your hands Gently dab the vulvar/vaginal area dry as needed Apply a "pea-sized" amount of the moisturizer onto your fingertip Using you other hand, open the labia   Apply the moisturizer to the vulvar/vaginal tissues Wear loose fitting underwear/clothing if possible following application  Use moisturize 2-3 times daily as desired.

## 2022-08-25 ENCOUNTER — Ambulatory Visit: Payer: BLUE CROSS/BLUE SHIELD | Admitting: Physician Assistant

## 2022-09-05 ENCOUNTER — Ambulatory Visit (INDEPENDENT_AMBULATORY_CARE_PROVIDER_SITE_OTHER): Payer: BLUE CROSS/BLUE SHIELD | Admitting: Physician Assistant

## 2022-09-05 ENCOUNTER — Encounter: Payer: Self-pay | Admitting: Physician Assistant

## 2022-09-05 VITALS — BP 148/83 | HR 85 | Temp 98.4°F | Resp 16 | Ht 61.0 in | Wt 111.8 lb

## 2022-09-05 DIAGNOSIS — R5383 Other fatigue: Secondary | ICD-10-CM

## 2022-09-05 DIAGNOSIS — R718 Other abnormality of red blood cells: Secondary | ICD-10-CM

## 2022-09-05 DIAGNOSIS — E559 Vitamin D deficiency, unspecified: Secondary | ICD-10-CM | POA: Diagnosis not present

## 2022-09-05 DIAGNOSIS — I10 Essential (primary) hypertension: Secondary | ICD-10-CM | POA: Diagnosis not present

## 2022-09-05 DIAGNOSIS — E782 Mixed hyperlipidemia: Secondary | ICD-10-CM | POA: Diagnosis not present

## 2022-09-05 DIAGNOSIS — E538 Deficiency of other specified B group vitamins: Secondary | ICD-10-CM

## 2022-09-05 NOTE — Progress Notes (Signed)
[facility] [place], [place] [address]  Internal MEDICINE  Office Visit Note  Patient Name: Kathryn Robbins  [id number]  [id number]  Date of Service: 09/13/2022  Chief Complaint  Patient presents with   Follow-up   Hypertension   Quality Metric Gaps    Shingles Vaccine    HPI Pt is here for routine follow up -Tolerating medications well -BP 371G systolic at home -BP stable on recheck, always elevated in office due to white coat syndrome, Stable at home. -Does get tired after work -May have b12 deficiency and will start oral supplement, will add this lab to her future labs when due. Advised to get these done in Spring -Continues to follow with GYN-ONC  Current Medication: Outpatient Encounter Medications as of 09/05/2022  Medication Sig   estradiol (ESTRACE VAGINAL) 0.1 MG/GM vaginal cream Apply dime size amount on finger and apply to the vulva and at entrance of the vagina nightly for 2 weeks then three times weekly   losartan (COZAAR) 50 MG tablet Take 1 tablet (50 mg total) by mouth daily.   rosuvastatin (CRESTOR) 5 MG tablet Take 1 tablet by mouth at night twice per week   No facility-administered encounter medications on file as of 09/05/2022.    Surgical History: Past Surgical History:  Procedure Laterality Date   COLONOSCOPY WITH PROPOFOL N/A 04/19/2022   Procedure: COLONOSCOPY WITH PROPOFOL;  Surgeon: Kathryn Bellows, MD;  Location: [facility] ENDOSCOPY;  Service: Gastroenterology;  Laterality: N/A;   HYSTEROSCOPY  08/2019   with cervical conization and D&C   INSERTION CENTRAL VENOUS ACCESS DEVICE W/ SUBCUTANEOUS PORT  09/2019   PORT-A-CATH REMOVAL     2021    Medical History: Past Medical History:  Diagnosis Date   Cancer ([place]) 2022   Cervical   History of radiation therapy    Hypertension    Personal history of chemotherapy     Family History: Family History  Problem Relation Age of Onset   Liver cancer Mother    Breast cancer  Neg Hx    Colon cancer Neg Hx    Ovarian cancer Neg Hx    Endometrial cancer Neg Hx    Pancreatic cancer Neg Hx    Prostate cancer Neg Hx     Social History   Socioeconomic History   Marital status: Married    Spouse name: Not on file   Number of children: Not on file   Years of education: Not on file   Highest education level: Not on file  Occupational History   Occupation: homemaker  Tobacco Use   Smoking status: Never   Smokeless tobacco: Never  Vaping Use   Vaping Use: Never used  Substance and Sexual Activity   Alcohol use: Never   Drug use: Never   Sexual activity: Not Currently  Other Topics Concern   Not on file  Social History Narrative   Not on file   Social Determinants of Health   Financial Resource Strain: Not on file  Food Insecurity: Not on file  Transportation Needs: Not on file  Physical Activity: Not on file  Stress: Not on file  Social Connections: Not on file  Intimate Partner Violence: Not on file      Review of Systems  Constitutional:  Negative for chills, fatigue and unexpected weight change.  HENT:  Negative for congestion, rhinorrhea, sneezing and sore throat.   Eyes:  Negative for redness.  Respiratory:  Negative for cough, chest tightness and shortness of breath.  Cardiovascular:  Negative for chest pain and palpitations.  Gastrointestinal:  Negative for abdominal pain, constipation, diarrhea, nausea and vomiting.  Genitourinary:  Negative for dysuria and frequency.  Musculoskeletal:  Negative for arthralgias, back pain, joint swelling and neck pain.  Skin:  Negative for rash.  Neurological: Negative.  Negative for tremors and numbness.  Hematological:  Negative for adenopathy. Does not bruise/bleed easily.  Psychiatric/Behavioral:  Negative for behavioral problems (Depression), sleep disturbance and suicidal ideas. The patient is nervous/anxious.     Vital Signs: BP (!) 148/83   Pulse 85   Temp 98.4 F (36.9 C)   Resp 16    Ht '5\' 1"'$  (1.549 m)   Wt 111 lb 12.8 oz (50.7 kg)   SpO2 99%   BMI 21.12 kg/m    Physical Exam Vitals and nursing note reviewed.  Constitutional:      General: She is not in acute distress.    Appearance: Normal appearance. She is well-developed and normal weight. She is not diaphoretic.  HENT:     Head: Normocephalic and atraumatic.     Mouth/Throat:     Pharynx: No oropharyngeal exudate.  Eyes:     Pupils: Pupils are equal, round, and reactive to light.  Neck:     Thyroid: No thyromegaly.     Vascular: No JVD.     Trachea: No tracheal deviation.  Cardiovascular:     Rate and Rhythm: Normal rate and regular rhythm.     Heart sounds: Normal heart sounds. No murmur heard.    No friction rub. No gallop.  Pulmonary:     Effort: Pulmonary effort is normal. No respiratory distress.     Breath sounds: No wheezing or rales.  Chest:     Chest wall: No tenderness.  Abdominal:     General: Bowel sounds are normal.     Palpations: Abdomen is soft.  Musculoskeletal:        General: Normal range of motion.     Cervical back: Normal range of motion and neck supple.  Lymphadenopathy:     Cervical: No cervical adenopathy.  Skin:    General: Skin is warm and dry.  Neurological:     Mental Status: She is alert and oriented to person, place, and time.     Cranial Nerves: No cranial nerve deficit.  Psychiatric:        Behavior: Behavior normal.        Thought Content: Thought content normal.        Judgment: Judgment normal.        Assessment/Plan: 1. White coat syndrome with diagnosis of hypertension Elevated in office, but always controled at home and will continue current medication and monitoring  2. Mixed hyperlipidemia Continue crestor and will update labs - Lipid Panel With LDL/HDL Ratio  3. B12 deficiency - B12 and Folate Panel  4. Vitamin D deficiency - VITAMIN D 25 Hydroxy (Vit-D Deficiency, Fractures)  5. Low mean corpuscular volume (MCV) -  Fe+TIBC+Fer  6. Other fatigue - Lipid Panel With LDL/HDL Ratio - TSH + free T4 - Comprehensive metabolic panel - CBC w/Diff/Platelet - B12 and Folate Panel - VITAMIN D 25 Hydroxy (Vit-D Deficiency, Fractures) - Fe+TIBC+Fer   General Counseling: Kathryn Robbins verbalizes understanding of the findings of todays visit and agrees with plan of treatment. I have discussed any further diagnostic evaluation that may be needed or ordered today. We also reviewed her medications today. she has been encouraged to call the office with any questions or concerns  that should arise related to todays visit.    Orders Placed This Encounter  Procedures   Lipid Panel With LDL/HDL Ratio   TSH + free T4   Comprehensive metabolic panel   CBC w/Diff/Platelet   B12 and Folate Panel   VITAMIN D 25 Hydroxy (Vit-D Deficiency, Fractures)   Fe+TIBC+Fer    No orders of the defined types were placed in this encounter.   This patient was seen by Drema Dallas, PA-C in collaboration with Dr. Clayborn Bigness as a part of collaborative care agreement.   Total time spent:30 Minutes Time spent includes review of chart, medications, test results, and follow up plan with the patient.      Dr Lavera Guise Internal medicine

## 2022-09-30 ENCOUNTER — Telehealth: Payer: Self-pay | Admitting: *Deleted

## 2022-09-30 NOTE — Telephone Encounter (Signed)
Moved the patient's appt from 12/8 to 12/26

## 2022-10-07 ENCOUNTER — Inpatient Hospital Stay: Payer: BLUE CROSS/BLUE SHIELD | Admitting: Gynecologic Oncology

## 2022-10-11 ENCOUNTER — Ambulatory Visit: Payer: 59 | Admitting: Gynecologic Oncology

## 2022-10-12 ENCOUNTER — Ambulatory Visit: Payer: 59 | Admitting: Gynecologic Oncology

## 2022-10-25 ENCOUNTER — Inpatient Hospital Stay: Payer: BLUE CROSS/BLUE SHIELD | Attending: Gynecologic Oncology | Admitting: Gynecologic Oncology

## 2022-10-25 ENCOUNTER — Encounter: Payer: Self-pay | Admitting: Gynecologic Oncology

## 2022-10-25 ENCOUNTER — Other Ambulatory Visit: Payer: Self-pay

## 2022-10-25 VITALS — BP 170/82 | HR 85 | Temp 98.5°F | Resp 14 | Ht 60.83 in | Wt 109.8 lb

## 2022-10-25 DIAGNOSIS — Z923 Personal history of irradiation: Secondary | ICD-10-CM | POA: Diagnosis not present

## 2022-10-25 DIAGNOSIS — Z8541 Personal history of malignant neoplasm of cervix uteri: Secondary | ICD-10-CM | POA: Insufficient documentation

## 2022-10-25 DIAGNOSIS — N952 Postmenopausal atrophic vaginitis: Secondary | ICD-10-CM | POA: Diagnosis not present

## 2022-10-25 DIAGNOSIS — N895 Stricture and atresia of vagina: Secondary | ICD-10-CM | POA: Insufficient documentation

## 2022-10-25 DIAGNOSIS — Z9221 Personal history of antineoplastic chemotherapy: Secondary | ICD-10-CM | POA: Diagnosis not present

## 2022-10-25 DIAGNOSIS — Z08 Encounter for follow-up examination after completed treatment for malignant neoplasm: Secondary | ICD-10-CM | POA: Diagnosis present

## 2022-10-25 DIAGNOSIS — C531 Malignant neoplasm of exocervix: Secondary | ICD-10-CM

## 2022-10-25 NOTE — Progress Notes (Signed)
Gynecologic Oncology Return Clinic Visit  10/25/22  Reason for Visit: surveillance in setting of history of cervical cancer  Treatment History: Patient has a history of stage IIA SCC of the cervix treated with primary chemoradiation (EBRT 45 Gy and HDR with T&O 30 Gy). She was treated at [facility] in [place].    Biopsies on 07/02/2019: EGD shows at least CIN-3, invasive basal carcinoma cannot be excluded.  Endometrial biopsy shows at least CIN-3.  Endometrium noted.  P16 staining is positive. On exam in late October, 2020, vaginal involvement was noted. Biopsies on 08/16/2019: D&C shows moderately to poorly differentiated keratinizing squamous cell carcinoma.  Normal endometrial mucosa not identified.  Endocervical curettage shows at least fragments of malignant/dysplastic squamous cells.  Cervical biopsy shows fragments of squamous mucosa with foci of invasive moderately differentiated squamous cell carcinoma.  LEEP shows invasive moderately to poorly differentiated squamous cell carcinoma, extensive.  Inked margins positive for tumor.  Posterior cervix, LEEP: Again show squamous cell carcinoma, tumor involves all fragments extensively. PET scan on 08/30/2019: Intense FDG metabolism of the cervix with mildly increased metabolism within the vaginal vault.  No distant metastatic disease. Radiation treatment summary: 40 Gray to the cervix and pelvic lymph nodes, 26 fractions, dose per fraction 1.8 Gray.  Patient then received HDR with T&O, total dose of 30 Gray, 6 fractions. Completed radiation on 11/21/2019.   Seen by radiation oncology on 03/05/21. She was NED at that time on exam. CT C/A/P from 03/03/21 revealed no metastatic disease. Seen 08/2021. Continued to be NED at that time, some vaginal stenosis. CT C/A/P 08/31/21 revealed no evidence of disease.   CT A/P on 04/22/22: 1. No acute intra-abdominal pathology identified. No evidence of recurrent or residual disease within the abdomen and  pelvis. 2. 14 mm indeterminate left adrenal nodule. Comparison with prior examinations, if available, is recommended. If none are available, this may be better assessed with dedicated adrenal mass protocol CT or MRI examination.  Interval History: Developed spotting since last visit with me. Was started on vaginal estrogen.  Since she was last seen in clinic, she notes 1 spot of blood when she pushed to urinate on 12/23.  Otherwise she denies any vaginal bleeding.  She denies any vaginal discharge.  She endorses normal bowel and bladder function.  She denies any abdominal or pelvic pain.  Past Medical/Surgical History: Past Medical History:  Diagnosis Date   Cancer ([place]) 2022   Cervical   History of radiation therapy    Hypertension    Personal history of chemotherapy     Past Surgical History:  Procedure Laterality Date   COLONOSCOPY WITH PROPOFOL N/A 04/19/2022   Procedure: COLONOSCOPY WITH PROPOFOL;  Surgeon: Jonathon Bellows, MD;  Location: [facility] ENDOSCOPY;  Service: Gastroenterology;  Laterality: N/A;   HYSTEROSCOPY  08/2019   with cervical conization and D&C   INSERTION CENTRAL VENOUS ACCESS DEVICE W/ SUBCUTANEOUS PORT  09/2019   PORT-A-CATH REMOVAL     2021    Family History  Problem Relation Age of Onset   Liver cancer Mother    Breast cancer Neg Hx    Colon cancer Neg Hx    Ovarian cancer Neg Hx    Endometrial cancer Neg Hx    Pancreatic cancer Neg Hx    Prostate cancer Neg Hx     Social History   Socioeconomic History   Marital status: Married    Spouse name: Not on file   Number of children: Not on file  Years of education: Not on file   Highest education level: Not on file  Occupational History   Occupation: homemaker  Tobacco Use   Smoking status: Never   Smokeless tobacco: Never  Vaping Use   Vaping Use: Never used  Substance and Sexual Activity   Alcohol use: Never   Drug use: Never   Sexual activity: Not Currently  Other Topics Concern   Not on  file  Social History Narrative   Not on file   Social Determinants of Health   Financial Resource Strain: Not on file  Food Insecurity: Not on file  Transportation Needs: Not on file  Physical Activity: Not on file  Stress: Not on file  Social Connections: Not on file    Current Medications:  Current Outpatient Medications:    estradiol (ESTRACE VAGINAL) 0.1 MG/GM vaginal cream, Apply dime size amount on finger and apply to the vulva and at entrance of the vagina nightly for 2 weeks then three times weekly, Disp: 42.5 g, Rfl: 3   losartan (COZAAR) 50 MG tablet, Take 1 tablet (50 mg total) by mouth daily., Disp: 90 tablet, Rfl: 3   rosuvastatin (CRESTOR) 5 MG tablet, Take 1 tablet by mouth at night twice per week, Disp: 30 tablet, Rfl: 3  Review of Systems: Denies appetite changes, fevers, chills, fatigue, unexplained weight changes. Denies hearing loss, neck lumps or masses, mouth sores, ringing in ears or voice changes. Denies cough or wheezing.  Denies shortness of breath. Denies chest pain or palpitations. Denies leg swelling. Denies abdominal distention, pain, blood in stools, constipation, diarrhea, nausea, vomiting, or early satiety. Denies pain with intercourse, dysuria, frequency, hematuria or incontinence. Denies hot flashes, pelvic pain, vaginal bleeding or vaginal discharge.   Denies joint pain, back pain or muscle pain/cramps. Denies itching, rash, or wounds. Denies dizziness, headaches, numbness or seizures. Denies swollen lymph nodes or glands, denies easy bruising or bleeding. Denies anxiety, depression, confusion, or decreased concentration.  Physical Exam: BP (!) 170/82 (BP Location: Left Arm, Patient Position: Sitting) Comment: informed Dr Berline Lopes will recheck  Pulse 85   Temp 98.5 F (36.9 C) (Oral)   Resp 14   Ht 5' 0.83" (1.545 m)   Wt 109 lb 12.8 oz (49.8 kg)   SpO2 100%   BMI 20.86 kg/m  General: Alert, oriented, no acute distress. HEENT:  Normocephalic, atraumatic, sclera anicteric. Chest: Clear to auscultation bilaterally.  No wheezes or rhonchi. Cardiovascular: Regular rate and rhythm, no murmurs. Abdomen: soft, nontender.  Normoactive bowel sounds.  No masses or hepatosplenomegaly appreciated.   Extremities: Grossly normal range of motion.  Warm, well perfused.  No edema bilaterally. Skin: No rashes or lesions noted. Lymphatics: No cervical, supraclavicular, or inguinal adenopathy. GU: Normal appearing external genitalia without erythema, excoriation, or lesions.  Atrophy and radiation changes noted along the periurethral tissue, likely source of patient's scant intermittent bleeding.  No active bleeding noted.  Speculum able to be placed approximately 1 cm.  On bimanual exam, vaginal length is only 1-2 cm.  No nodularity or masses palpated.  Rectal exam, no nodularity or masses palpated.  Laboratory & Radiologic Studies: None new  Assessment & Plan: Kathryn Robbins is a 56 y.o. 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  Last vaginal pap NIML, HR HPV negative, 03/2022.   Patient is overall doing well and is NED on exam today.    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  She is being followed with every 6 months imaging prior to moving to [place].  On exam, I cannot see the apex of the vagina/cervix nor palpated.  On rectal exam, I do not palpate any masses.  Her last CT scan was approximately 6 months ago.  Plan for repeat CT scan which was scheduled today.  Vaginal Pap and HPV testing were performed at her last visit.   Per NCCN surveillance recommendations, we will continue with surveillance visits every 6 months until 5 years after completion of treatment, which will be in 10/2024.  We reviewed signs and symptoms that would be concerning  for cancer recurrence, and I stressed the need of calling my office to be seen sooner if she develops any of these symptoms.  No obvious source of bleeding seen today although I suspect that the atrophy at her distal vagina has been the cause of her intermittent symptoms.  Discussed continuing to use vaginal estrogen which was prescribed to her when she was last seen in our clinic.  22 minutes of total time was spent for this patient encounter, including preparation, face-to-face counseling with the patient and coordination of care, and documentation of the encounter.  Jeral Pinch, MD  Division of Gynecologic Oncology  Department of Obstetrics and Gynecology  [facility]

## 2022-10-25 NOTE — Patient Instructions (Addendum)
It was good to see you today.  I do not see or feel any evidence of cancer recurrence on your exam.  I will let you know once I get your CT results.  Please keep using the vaginal estrogen 3 times a week at night.  I will see you for follow-up in 6 months.  As always, if you develop any new and concerning symptoms before your next visit, please call to see me sooner.

## 2022-10-26 ENCOUNTER — Telehealth: Payer: Self-pay

## 2022-10-26 NOTE — Telephone Encounter (Signed)
[employer]IA [employer] Daneil Dan QU#[id number] Voicemail left for pt to call office regarding her request to reschedule the CT scan ordered by Dr. Berline Lopes.  Appointment is 11/08/22 @ 9:30am, arrive at 7:30am for contrast

## 2022-10-27 NOTE — Telephone Encounter (Signed)
I spoke to Ms. Kathryn Robbins's son, Kathryn Robbins, he states they can not make the 1/9 CT scan appointment. I gave him the number to call and reschedule according to his schedule. He agreed and will call today.

## 2022-10-28 NOTE — Telephone Encounter (Signed)
I spoke to Nishit, Kathryn Robbins's son, he states he will call today to reschedule the CT scan. He is aware I will follow up with him next week. Verified scheduling number with him.

## 2022-11-01 NOTE — Telephone Encounter (Signed)
Pt is scheduled for CT on 11/10/22

## 2022-11-02 ENCOUNTER — Ambulatory Visit: Payer: BLUE CROSS/BLUE SHIELD

## 2022-11-08 ENCOUNTER — Ambulatory Visit: Payer: BLUE CROSS/BLUE SHIELD

## 2022-11-10 ENCOUNTER — Ambulatory Visit
Admission: RE | Admit: 2022-11-10 | Discharge: 2022-11-10 | Disposition: A | Discharge status: Home / Self Care | Payer: 59 | Source: Ambulatory Visit | Attending: Gynecologic Oncology | Admitting: Gynecologic Oncology

## 2022-11-10 ENCOUNTER — Encounter: Payer: Self-pay | Admitting: Physician Assistant

## 2022-11-10 ENCOUNTER — Ambulatory Visit (INDEPENDENT_AMBULATORY_CARE_PROVIDER_SITE_OTHER): Payer: 59 | Admitting: Physician Assistant

## 2022-11-10 VITALS — BP 139/75 | HR 80 | Temp 98.5°F | Resp 16 | Ht 60.0 in | Wt 111.0 lb

## 2022-11-10 DIAGNOSIS — I10 Essential (primary) hypertension: Secondary | ICD-10-CM | POA: Diagnosis not present

## 2022-11-10 DIAGNOSIS — E559 Vitamin D deficiency, unspecified: Secondary | ICD-10-CM | POA: Diagnosis not present

## 2022-11-10 DIAGNOSIS — M130 Polyarthritis, unspecified: Secondary | ICD-10-CM

## 2022-11-10 DIAGNOSIS — E782 Mixed hyperlipidemia: Secondary | ICD-10-CM

## 2022-11-10 DIAGNOSIS — I700 Atherosclerosis of aorta: Secondary | ICD-10-CM | POA: Diagnosis not present

## 2022-11-10 DIAGNOSIS — R718 Other abnormality of red blood cells: Secondary | ICD-10-CM

## 2022-11-10 DIAGNOSIS — R5383 Other fatigue: Secondary | ICD-10-CM

## 2022-11-10 DIAGNOSIS — E538 Deficiency of other specified B group vitamins: Secondary | ICD-10-CM

## 2022-11-10 DIAGNOSIS — C531 Malignant neoplasm of exocervix: Secondary | ICD-10-CM

## 2022-11-10 LAB — POCT I-STAT CREATININE: Creatinine, Ser: 0.6 mg/dL (ref 0.44–1.00)

## 2022-11-10 MED ORDER — IOHEXOL 300 MG/ML  SOLN
100.0000 mL | Freq: Once | INTRAMUSCULAR | Status: AC | PRN
  Administered 2022-11-10: 100 mL via INTRAVENOUS

## 2022-11-10 MED ORDER — MELOXICAM 7.5 MG PO TABS: 7.5000 mg | ORAL_TABLET | Freq: Every day | ORAL | 2 refills | Status: DC

## 2022-11-10 NOTE — Progress Notes (Signed)
[facility] [place], [place] [address]  Internal MEDICINE  Office Visit Note  Patient Name: Kathryn Robbins  [id number]  [id number]  Date of Service: 11/10/2022  Chief Complaint  Patient presents with   Hand Pain    Bilateral hand pain   Knee Pain    Bilateral knee pain     HPI Pt is here for a sick visit. -Bilateral hand pain for a little while now, worse in morning and at night. Stiffness in fingers. Thinks she is having arthritis. Reports her sister had this happen a few years ago and is a few years older than her -Knee pain on left side when she first puts weight through it, once up and walking it is ok. No swelling -She is due for routine labs before CPE in a few months and will go ahead and add inflammatory labs  Current Medication:  Outpatient Encounter Medications as of 11/10/2022  Medication Sig   estradiol (ESTRACE VAGINAL) 0.1 MG/GM vaginal cream Apply dime size amount on finger and apply to the vulva and at entrance of the vagina nightly for 2 weeks then three times weekly   losartan (COZAAR) 50 MG tablet Take 1 tablet (50 mg total) by mouth daily.   meloxicam (MOBIC) 7.5 MG tablet Take 1 tablet (7.5 mg total) by mouth daily.   rosuvastatin (CRESTOR) 5 MG tablet Take 1 tablet by mouth at night twice per week   No facility-administered encounter medications on file as of 11/10/2022.      Medical History: Past Medical History:  Diagnosis Date   Cancer ([place]) 2022   Cervical   History of radiation therapy    Hypertension    Personal history of chemotherapy      Vital Signs: BP 139/75   Pulse 80   Temp 98.5 F (36.9 C)   Resp 16   Ht 5' (1.524 m)   Wt 111 lb (50.3 kg)   SpO2 99%   BMI 21.68 kg/m    Review of Systems  Constitutional:  Negative for fatigue and fever.  HENT:  Negative for congestion, mouth sores and postnasal drip.   Respiratory:  Negative for cough.   Cardiovascular:  Negative for chest pain.   Genitourinary:  Negative for flank pain.  Musculoskeletal:  Positive for arthralgias.       Bilateral hand and left knee pain  Psychiatric/Behavioral: Negative.      Physical Exam Vitals and nursing note reviewed.  Constitutional:      General: She is not in acute distress.    Appearance: Normal appearance. She is well-developed and normal weight. She is not diaphoretic.  HENT:     Head: Normocephalic and atraumatic.     Mouth/Throat:     Pharynx: No oropharyngeal exudate.  Eyes:     Pupils: Pupils are equal, round, and reactive to light.  Neck:     Thyroid: No thyromegaly.     Vascular: No JVD.     Trachea: No tracheal deviation.  Cardiovascular:     Rate and Rhythm: Normal rate and regular rhythm.     Heart sounds: Normal heart sounds. No murmur heard.    No friction rub. No gallop.  Pulmonary:     Effort: Pulmonary effort is normal. No respiratory distress.     Breath sounds: No wheezing or rales.  Chest:     Chest wall: No tenderness.  Abdominal:     General: Bowel sounds are normal.     Palpations: Abdomen  is soft.  Musculoskeletal:        General: Swelling and tenderness present. Normal range of motion.     Cervical back: Normal range of motion and neck supple.     Comments: Full ROM of both hands, but painful and some swelling along joints. Full ROM of Left knee--nontender or swollen but painful on initiation of standing only.  Lymphadenopathy:     Cervical: No cervical adenopathy.  Skin:    General: Skin is warm and dry.  Neurological:     Mental Status: She is alert and oriented to person, place, and time.     Cranial Nerves: No cranial nerve deficit.  Psychiatric:        Behavior: Behavior normal.        Thought Content: Thought content normal.        Judgment: Judgment normal.       Assessment/Plan: 1. Polyarthritis Will check labs for inflammatory markers. Will go ahead and start meloxicam as needed. May also try tylenol instead. Call if new or  worsening symptoms, May need to consider rheum/ortho pending labs and response - Rheumatoid Factor - Sed Rate (ESR) - ANA w/Reflex if Positive - meloxicam (MOBIC) 7.5 MG tablet; Take 1 tablet (7.5 mg total) by mouth daily.  Dispense: 30 tablet; Refill: 2  2. White coat syndrome with diagnosis of hypertension Stable, continue current medication  3. Mixed hyperlipidemia - Lipid Panel With LDL/HDL Ratio  4. B12 deficiency - B12 and Folate Panel  5. Vitamin D deficiency - VITAMIN D 25 Hydroxy (Vit-D Deficiency, Fractures)  6. Low mean corpuscular volume (MCV) - CBC w/Diff/Platelet  7. Other fatigue - CBC w/Diff/Platelet - Comprehensive metabolic panel - TSH + free T4 - Lipid Panel With LDL/HDL Ratio - VITAMIN D 25 Hydroxy (Vit-D Deficiency, Fractures) - Fe+TIBC+Fer - B12 and Folate Panel - Rheumatoid Factor - Sed Rate (ESR) - ANA w/Reflex if Positive   General Counseling: Ileene verbalizes understanding of the findings of todays visit and agrees with plan of treatment. I have discussed any further diagnostic evaluation that may be needed or ordered today. We also reviewed her medications today. she has been encouraged to call the office with any questions or concerns that should arise related to todays visit.    Counseling:    Orders Placed This Encounter  Procedures   CBC w/Diff/Platelet   Comprehensive metabolic panel   TSH + free T4   Lipid Panel With LDL/HDL Ratio   VITAMIN D 25 Hydroxy (Vit-D Deficiency, Fractures)   Fe+TIBC+Fer   B12 and Folate Panel   Rheumatoid Factor   Sed Rate (ESR)   ANA w/Reflex if Positive    Meds ordered this encounter  Medications   meloxicam (MOBIC) 7.5 MG tablet    Sig: Take 1 tablet (7.5 mg total) by mouth daily.    Dispense:  30 tablet    Refill:  2    Time spent:30 Minutes

## 2022-11-16 DIAGNOSIS — E782 Mixed hyperlipidemia: Secondary | ICD-10-CM | POA: Diagnosis not present

## 2022-11-16 DIAGNOSIS — R5383 Other fatigue: Secondary | ICD-10-CM | POA: Diagnosis not present

## 2022-11-16 DIAGNOSIS — E559 Vitamin D deficiency, unspecified: Secondary | ICD-10-CM | POA: Diagnosis not present

## 2022-11-16 DIAGNOSIS — E538 Deficiency of other specified B group vitamins: Secondary | ICD-10-CM | POA: Diagnosis not present

## 2022-11-16 DIAGNOSIS — M130 Polyarthritis, unspecified: Secondary | ICD-10-CM | POA: Diagnosis not present

## 2022-11-16 DIAGNOSIS — R718 Other abnormality of red blood cells: Secondary | ICD-10-CM | POA: Diagnosis not present

## 2022-11-17 LAB — B12 AND FOLATE PANEL
Folate: 12.1 ng/mL (ref >3.0)
Vitamin B-12: 184 pg/mL — ABNORMAL LOW (ref 232–1245)

## 2022-11-17 LAB — VITAMIN D 25 HYDROXY (VIT D DEFICIENCY, FRACTURES): Vit D, 25-Hydroxy: 25.9 ng/mL — ABNORMAL LOW (ref 30.0–100.0)

## 2022-11-17 LAB — CBC WITH DIFFERENTIAL/PLATELET
Basophils Absolute: 0 10*3/uL (ref 0.0–0.2)
Basos: 1 %
EOS (ABSOLUTE): 0.1 10*3/uL (ref 0.0–0.4)
Eos: 1 %
Hematocrit: 40.5 % (ref 34.0–46.6)
Hemoglobin: 13.2 g/dL (ref 11.1–15.9)
Immature Grans (Abs): 0 10*3/uL (ref 0.0–0.1)
Immature Granulocytes: 0 %
Lymphocytes Absolute: 2.9 10*3/uL (ref 0.7–3.1)
Lymphs: 36 %
MCH: 26.1 pg — ABNORMAL LOW (ref 26.6–33.0)
MCHC: 32.6 g/dL (ref 31.5–35.7)
MCV: 80 fL (ref 79–97)
Monocytes Absolute: 0.5 10*3/uL (ref 0.1–0.9)
Monocytes: 6 %
Neutrophils Absolute: 4.6 10*3/uL (ref 1.4–7.0)
Neutrophils: 56 %
Platelets: 254 10*3/uL (ref 150–450)
RBC: 5.05 x10E6/uL (ref 3.77–5.28)
RDW: 12.5 % (ref 11.7–15.4)
WBC: 8.2 10*3/uL (ref 3.4–10.8)

## 2022-11-17 LAB — IRON,TIBC AND FERRITIN PANEL
Ferritin: 69 ng/mL (ref 15–150)
Iron Saturation: 44 % (ref 15–55)
Iron: 136 ug/dL (ref 27–159)
Total Iron Binding Capacity: 307 ug/dL (ref 250–450)
UIBC: 171 ug/dL (ref 131–425)

## 2022-11-17 LAB — COMPREHENSIVE METABOLIC PANEL
ALT: 20 IU/L (ref 0–32)
AST: 18 IU/L (ref 0–40)
Albumin/Globulin Ratio: 1.5 (ref 1.2–2.2)
Albumin: 4.4 g/dL (ref 3.8–4.9)
Alkaline Phosphatase: 122 IU/L — ABNORMAL HIGH (ref 44–121)
BUN/Creatinine Ratio: 15 (ref 9–23)
BUN: 10 mg/dL (ref 6–24)
Bilirubin Total: 0.7 mg/dL (ref 0.0–1.2)
CO2: 22 mmol/L (ref 20–29)
Calcium: 9.6 mg/dL (ref 8.7–10.2)
Chloride: 106 mmol/L (ref 96–106)
Creatinine, Ser: 0.67 mg/dL (ref 0.57–1.00)
Globulin, Total: 2.9 g/dL (ref 1.5–4.5)
Glucose: 97 mg/dL (ref 70–99)
Potassium: 4.4 mmol/L (ref 3.5–5.2)
Sodium: 142 mmol/L (ref 134–144)
Total Protein: 7.3 g/dL (ref 6.0–8.5)
eGFR: 101 mL/min/{1.73_m2} (ref >59)

## 2022-11-17 LAB — LIPID PANEL WITH LDL/HDL RATIO
Cholesterol, Total: 178 mg/dL (ref 100–199)
HDL: 64 mg/dL (ref >39)
LDL Chol Calc (NIH): 100 mg/dL — ABNORMAL HIGH (ref 0–99)
LDL/HDL Ratio: 1.6 ratio (ref 0.0–3.2)
Triglycerides: 76 mg/dL (ref 0–149)
VLDL Cholesterol Cal: 14 mg/dL (ref 5–40)

## 2022-11-17 LAB — TSH+FREE T4
Free T4: 1.32 ng/dL (ref 0.82–1.77)
TSH: 2.31 u[IU]/mL (ref 0.450–4.500)

## 2022-11-17 LAB — SEDIMENTATION RATE: Sed Rate: 24 mm/hr (ref 0–40)

## 2022-11-17 LAB — ANA W/REFLEX IF POSITIVE: Anti Nuclear Antibody (ANA): NEGATIVE

## 2022-11-17 LAB — RHEUMATOID FACTOR: Rheumatoid fact SerPl-aCnc: <10 IU/mL (ref <14.0)

## 2022-12-05 ENCOUNTER — Ambulatory Visit: Payer: 59 | Admitting: Physician Assistant

## 2022-12-05 ENCOUNTER — Encounter: Payer: Self-pay | Admitting: Physician Assistant

## 2022-12-05 VITALS — BP 138/74 | HR 75 | Temp 97.6°F | Resp 16 | Ht 60.0 in | Wt 112.8 lb

## 2022-12-05 DIAGNOSIS — I10 Essential (primary) hypertension: Secondary | ICD-10-CM | POA: Diagnosis not present

## 2022-12-05 DIAGNOSIS — E782 Mixed hyperlipidemia: Secondary | ICD-10-CM

## 2022-12-05 DIAGNOSIS — E538 Deficiency of other specified B group vitamins: Secondary | ICD-10-CM | POA: Diagnosis not present

## 2022-12-05 DIAGNOSIS — M130 Polyarthritis, unspecified: Secondary | ICD-10-CM | POA: Diagnosis not present

## 2022-12-05 DIAGNOSIS — E559 Vitamin D deficiency, unspecified: Secondary | ICD-10-CM

## 2022-12-05 NOTE — Progress Notes (Signed)
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 Pain started after working there, not prior. Started working there about 6 months ago.  -Will try meloxicam now and if not improving then may consider xray. -labs reviewed: cholesterol improved, vit D low and will start supplement, B12 also low and prefers to start oral B12 after an injection today  Current Medication: Outpatient Encounter Medications as of 12/05/2022  Medication Sig   estradiol (ESTRACE VAGINAL) 0.1 MG/GM vaginal cream Apply dime size amount on finger and apply to the vulva and at entrance of the vagina nightly for 2 weeks then three times weekly   losartan (COZAAR) 50 MG tablet Take 1 tablet (50 mg total) by mouth daily.   meloxicam (MOBIC) 7.5 MG tablet Take 1 tablet (7.5 mg total) by mouth daily.   rosuvastatin (CRESTOR) 5 MG tablet Take 1 tablet by mouth at night twice per week   No facility-administered encounter medications on file as of 12/05/2022.    Surgical History: Past Surgical History:  Procedure Laterality Date   COLONOSCOPY WITH PROPOFOL N/A 04/19/2022   Procedure: COLONOSCOPY WITH PROPOFOL;  Surgeon: Jonathon Bellows, MD;  Location: [facility] ENDOSCOPY;  Service: Gastroenterology;  Laterality: N/A;   HYSTEROSCOPY  08/2019   with cervical conization and D&C   INSERTION CENTRAL VENOUS ACCESS DEVICE W/ SUBCUTANEOUS PORT  09/2019   PORT-A-CATH REMOVAL     2021    Medical  History: Past Medical History:  Diagnosis Date   Cancer ([place]) 2022   Cervical   History of radiation therapy    Hypertension    Personal history of chemotherapy     Family History: Family History  Problem Relation Age of Onset   Liver cancer Mother    Breast cancer Neg Hx    Colon cancer Neg Hx    Ovarian cancer Neg Hx    Endometrial cancer Neg Hx    Pancreatic cancer Neg Hx    Prostate cancer Neg Hx     Social History   Socioeconomic History   Marital status: Married    Spouse name: Not on file   Number of children: Not on file   Years of education: Not on file   Highest education level: Not on file  Occupational History   Occupation: homemaker  Tobacco Use   Smoking status: Never   Smokeless tobacco: Never  Vaping Use   Vaping Use: Never used  Substance and Sexual Activity   Alcohol use: Never   Drug use: Never   Sexual activity: Not Currently  Other Topics Concern   Not on file  Social History Narrative   Not on file   Social Determinants of Health   Financial Resource Strain: Not on file  Food Insecurity: Not on file  Transportation Needs: Not on file  Physical Activity: Not on file  Stress: Not on file  Social Connections: Not on file  Intimate Partner Violence: Not on file      Review of Systems  Constitutional:  Negative for fatigue and fever.  HENT:  Negative for congestion, mouth sores and postnasal drip.   Respiratory:  Negative for cough.   Cardiovascular:  Negative for chest pain.  Genitourinary:  Negative for flank pain.  Musculoskeletal:  Positive for arthralgias.       Bilateral hand and left knee pain  Psychiatric/Behavioral: Negative.      Vital Signs: BP 138/74 Comment: 144/78  Pulse 75   Temp 97.6 F (36.4 C)   Resp 16   Ht 5' (1.524 m)   Wt 112 lb 12.8 oz (51.2 kg)   SpO2 98%   BMI 22.03 kg/m    Physical Exam Vitals and nursing note reviewed.  Constitutional:      General: She is not in acute distress.     Appearance: Normal appearance. She is well-developed and normal weight. She is not diaphoretic.  HENT:     Head: Normocephalic and atraumatic.     Mouth/Throat:     Pharynx: No oropharyngeal exudate.  Eyes:     Pupils: Pupils are equal, round, and reactive to light.  Neck:     Thyroid: No thyromegaly.     Vascular: No JVD.     Trachea: No tracheal deviation.  Cardiovascular:     Rate and Rhythm: Normal rate and regular rhythm.     Heart sounds: Normal heart sounds. No murmur heard.    No friction rub. No gallop.  Pulmonary:     Effort: Pulmonary effort is normal. No respiratory distress.     Breath sounds: No wheezing or rales.  Chest:     Chest wall: No tenderness.  Abdominal:     General: Bowel sounds are normal.     Palpations: Abdomen is soft.  Musculoskeletal:        General: Swelling and tenderness present. Normal range of motion.     Cervical back: Normal range of motion and neck supple.     Comments: Increased pain along left pinky and at base of pinky into palm, not significantly swollen. Some stiffness, but full ROM  Lymphadenopathy:     Cervical: No cervical adenopathy.  Skin:    General: Skin is warm and dry.  Neurological:     Mental Status: She is alert and oriented to person, place, and time.     Cranial Nerves: No cranial nerve deficit.  Psychiatric:        Behavior: Behavior normal.        Thought Content: Thought content normal.        Judgment: Judgment normal.        Assessment/Plan: 1. Polyarthritis Will try meloxicam/tylenol and will ice pinky where pain is worst. If not improving could consider xray vs ortho referral.  2. Essential hypertension Stable, continue losartan as before  3. Mixed hyperlipidemia Improving, continue crestor  4. B12 deficiency Will start with supplement, will have injection today, but prefers oral supplementation after today  5. Vitamin D deficiency Will start vit D supplement   General Counseling:  Jasa verbalizes understanding of the findings of todays visit and agrees with plan of treatment. I have discussed any further diagnostic evaluation that may be needed or ordered today. We also reviewed her medications today. she has been encouraged to call the office with any questions or concerns that should arise related to todays visit.    No orders of the defined types were placed in  this encounter.   No orders of the defined types were placed in this encounter.   This patient was seen by Drema Dallas, PA-C in collaboration with Dr. Clayborn Bigness as a part of collaborative care agreement.   Total time spent:30 Minutes Time spent includes review of chart, medications, test results, and follow up plan with the patient.      Dr Lavera Guise Internal medicine

## 2022-12-13 MED ORDER — CYANOCOBALAMIN 1000 MCG/ML IJ SOLN
1000.0000 ug | Freq: Once | INTRAMUSCULAR | Status: AC
  Administered 2022-12-05: 1000 ug via INTRAMUSCULAR

## 2022-12-13 NOTE — Addendum Note (Signed)
Addended by: Toma Deiters on: 12/13/2022 03:59 PM   Modules accepted: Orders

## 2023-01-02 ENCOUNTER — Ambulatory Visit: Payer: 59 | Admitting: Physician Assistant

## 2023-02-05 ENCOUNTER — Other Ambulatory Visit: Payer: Self-pay | Admitting: Physician Assistant

## 2023-02-05 DIAGNOSIS — M130 Polyarthritis, unspecified: Secondary | ICD-10-CM

## 2023-03-01 ENCOUNTER — Telehealth: Payer: Self-pay | Admitting: Oncology

## 2023-03-01 NOTE — Telephone Encounter (Signed)
Called Nishit back and advised that she may continue to have these symptoms on and off but to atrophy in the vagina and around the urethra.  The recommendation would be to continue using the premarin cream and pat dry when toileting.  He verbalized understanding and would like to move her appointment up with Dr. Pricilla Holm if possible because Tanisa is still concerned about it.  Advised we will call back if a sooner appointment becomes available.

## 2023-03-01 NOTE — Telephone Encounter (Signed)
Called Nishit (son) regarding Mychart message.  He interpreted for his mother and said she has been seeing spots of blood in her urine once or twice a week since January.  It happened again yesterday and she was concerned.  She denies having dysuria or increase in urinary frequency.  She is using the Estrace cream as directed.  She is wondering if she needs appointment and scan.

## 2023-03-02 NOTE — Telephone Encounter (Signed)
Rescheduled appointment to 03/24/23 with Kathryn Mccreedy, NP at 1:00.  Kathryn Robbins verbalized understanding and agreement of the appointment date and time.

## 2023-03-15 DIAGNOSIS — Z5982 Transportation insecurity: Secondary | ICD-10-CM | POA: Diagnosis not present

## 2023-03-15 DIAGNOSIS — M1990 Unspecified osteoarthritis, unspecified site: Secondary | ICD-10-CM | POA: Diagnosis not present

## 2023-03-15 DIAGNOSIS — E785 Hyperlipidemia, unspecified: Secondary | ICD-10-CM | POA: Diagnosis not present

## 2023-03-15 DIAGNOSIS — Z8541 Personal history of malignant neoplasm of cervix uteri: Secondary | ICD-10-CM | POA: Diagnosis not present

## 2023-03-15 DIAGNOSIS — I10 Essential (primary) hypertension: Secondary | ICD-10-CM | POA: Diagnosis not present

## 2023-03-15 DIAGNOSIS — Z809 Family history of malignant neoplasm, unspecified: Secondary | ICD-10-CM | POA: Diagnosis not present

## 2023-03-15 DIAGNOSIS — I700 Atherosclerosis of aorta: Secondary | ICD-10-CM | POA: Diagnosis not present

## 2023-03-24 ENCOUNTER — Other Ambulatory Visit (HOSPITAL_COMMUNITY)
Admission: RE | Admit: 2023-03-24 | Discharge: 2023-03-24 | Disposition: A | Discharge status: Home / Self Care | Payer: 59 | Source: Ambulatory Visit

## 2023-03-24 ENCOUNTER — Other Ambulatory Visit: Payer: Self-pay

## 2023-03-24 ENCOUNTER — Inpatient Hospital Stay: Payer: 59 | Attending: Gynecologic Oncology | Admitting: Gynecologic Oncology

## 2023-03-24 VITALS — BP 155/63 | HR 81 | Temp 98.2°F | Resp 16 | Ht 61.81 in | Wt 113.0 lb

## 2023-03-24 DIAGNOSIS — Z79899 Other long term (current) drug therapy: Secondary | ICD-10-CM | POA: Insufficient documentation

## 2023-03-24 DIAGNOSIS — Z791 Long term (current) use of non-steroidal anti-inflammatories (NSAID): Secondary | ICD-10-CM | POA: Insufficient documentation

## 2023-03-24 DIAGNOSIS — R200 Anesthesia of skin: Secondary | ICD-10-CM | POA: Insufficient documentation

## 2023-03-24 DIAGNOSIS — Z8541 Personal history of malignant neoplasm of cervix uteri: Secondary | ICD-10-CM | POA: Diagnosis not present

## 2023-03-24 DIAGNOSIS — R319 Hematuria, unspecified: Secondary | ICD-10-CM

## 2023-03-24 DIAGNOSIS — C531 Malignant neoplasm of exocervix: Secondary | ICD-10-CM

## 2023-03-24 DIAGNOSIS — Z923 Personal history of irradiation: Secondary | ICD-10-CM

## 2023-03-24 DIAGNOSIS — Z800 Family history of malignant neoplasm of digestive organs: Secondary | ICD-10-CM | POA: Insufficient documentation

## 2023-03-24 DIAGNOSIS — R202 Paresthesia of skin: Secondary | ICD-10-CM

## 2023-03-24 DIAGNOSIS — C539 Malignant neoplasm of cervix uteri, unspecified: Secondary | ICD-10-CM | POA: Insufficient documentation

## 2023-03-24 DIAGNOSIS — R309 Painful micturition, unspecified: Secondary | ICD-10-CM | POA: Diagnosis not present

## 2023-03-24 DIAGNOSIS — Z9221 Personal history of antineoplastic chemotherapy: Secondary | ICD-10-CM

## 2023-03-24 DIAGNOSIS — N952 Postmenopausal atrophic vaginitis: Secondary | ICD-10-CM

## 2023-03-24 DIAGNOSIS — N939 Abnormal uterine and vaginal bleeding, unspecified: Secondary | ICD-10-CM | POA: Diagnosis not present

## 2023-03-24 DIAGNOSIS — N905 Atrophy of vulva: Secondary | ICD-10-CM

## 2023-03-24 MED ORDER — ESTRADIOL 0.1 MG/GM VA CREA: TOPICAL_CREAM | VAGINAL | 3 refills | Status: DC

## 2023-03-24 NOTE — Progress Notes (Unsigned)
Patient reports doing well since her last visit.  She has noticed spotting after urination intermittently.  She states it may last for 2 days then and resolves.  She does report burning on the outside vulva skin with urination.  She has numbness tingling in her hands at the beginning of the morning that resolves.  No other concerns voiced.  Tolerating diet with no nausea or emesis.  No abdominal pain reported.  Emptying bladder completely.  Bowels moving.

## 2023-03-24 NOTE — Patient Instructions (Addendum)
It was good to see you today.  I do not see or feel any evidence of cancer recurrence on your exam. No concerning findings on today's exam.  We obtained a pap smear today and we will contact you with the results.    Continue using the vaginal estrogen 3 times a week at night on the vulva. You can expect to have some light spotting intermittently.   Plan to follow up in November 2024 or sooner if needed.    Symptoms to report to your health care team include vaginal bleeding, rectal bleeding, bloating, weight loss without effort, new and persistent pain, new and  persistent fatigue, new leg swelling, new masses (i.e., bumps in your neck or groin), new and persistent cough, new and persistent nausea and vomiting, change in bowel or bladder habits, and any other concerns.

## 2023-04-03 ENCOUNTER — Telehealth: Payer: Self-pay | Admitting: *Deleted

## 2023-04-03 LAB — CYTOLOGY - PAP
Comment: NEGATIVE
Diagnosis: NEGATIVE
High risk HPV: NEGATIVE

## 2023-04-03 NOTE — Telephone Encounter (Signed)
-----   Message from Doylene Bode, NP sent at 04/03/2023  4:45 PM EDT ----- Please let the patient know pap smear is normal. No sign of precancer or cancer. HPV high risk virus is not detected (negative). Good news!  ----- Message ----- From: Interface, Lab In Three Zero One Sent: 04/03/2023   3:45 PM EDT To: Doylene Bode, NP

## 2023-04-03 NOTE — Telephone Encounter (Signed)
Attempted to reach Ms. Bubar to [employer] of results from Warner Mccreedy, NP. Left voicemail for patient to call the office at [phone]. [employer] [phone] for language Katy Fitch used for this call.

## 2023-04-04 NOTE — Telephone Encounter (Signed)
-----   Message from Melissa D Cross, NP sent at 04/03/2023  4:45 PM EDT ----- Please let the patient know pap smear is normal. No sign of precancer or cancer. HPV high risk virus is not detected (negative). Good news!  ----- Message ----- From: Interface, Lab In Three Zero One Sent: 04/03/2023   3:45 PM EDT To: Melissa D Cross, NP   

## 2023-04-04 NOTE — Telephone Encounter (Signed)
Third attempt to reach Ms. Steffek through [employer] id [phone] to relay results from Warner Mccreedy, NP. Of her Pap results.

## 2023-04-04 NOTE — Telephone Encounter (Signed)
Called patient and LVM for return call with [employer] (ID # [phone]).

## 2023-04-26 IMAGING — MG MM DIGITAL SCREENING BILAT W/ TOMO AND CAD
8 series · 8 of 24 positions shown · non-contrast
Comparison: Previous exam(s).

CLINICAL DATA: Screening.

EXAM:
DIGITAL SCREENING BILATERAL MAMMOGRAM WITH TOMOSYNTHESIS AND CAD
TECHNIQUE: Bilateral screening digital craniocaudal and mediolateral oblique
mammograms were obtained. Bilateral screening digital breast
tomosynthesis was performed. The images were evaluated with
computer-aided detection.

[L MLO synth-2D]
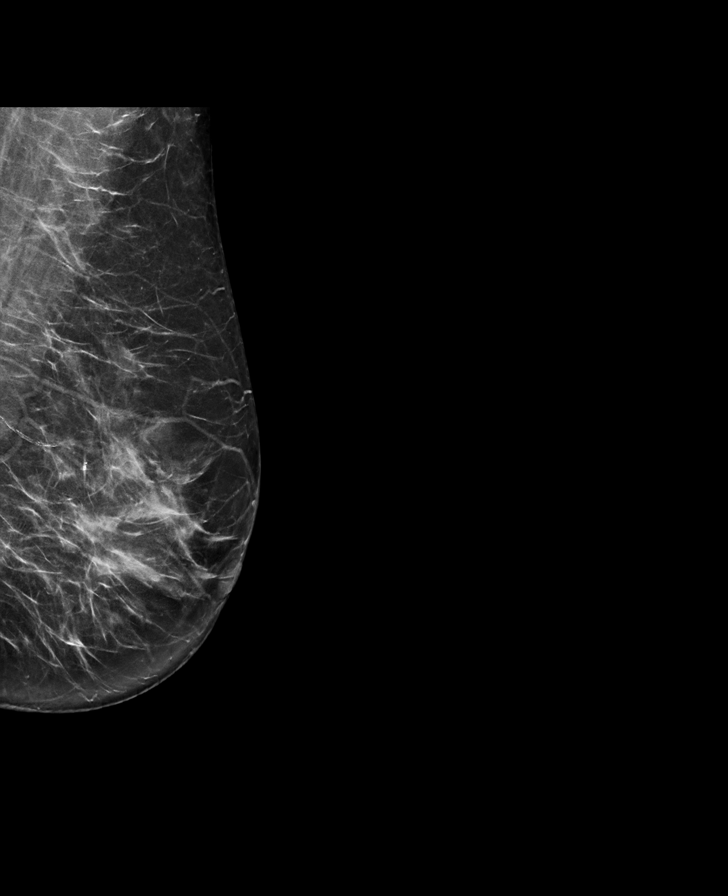

[R MLO synth-2D]
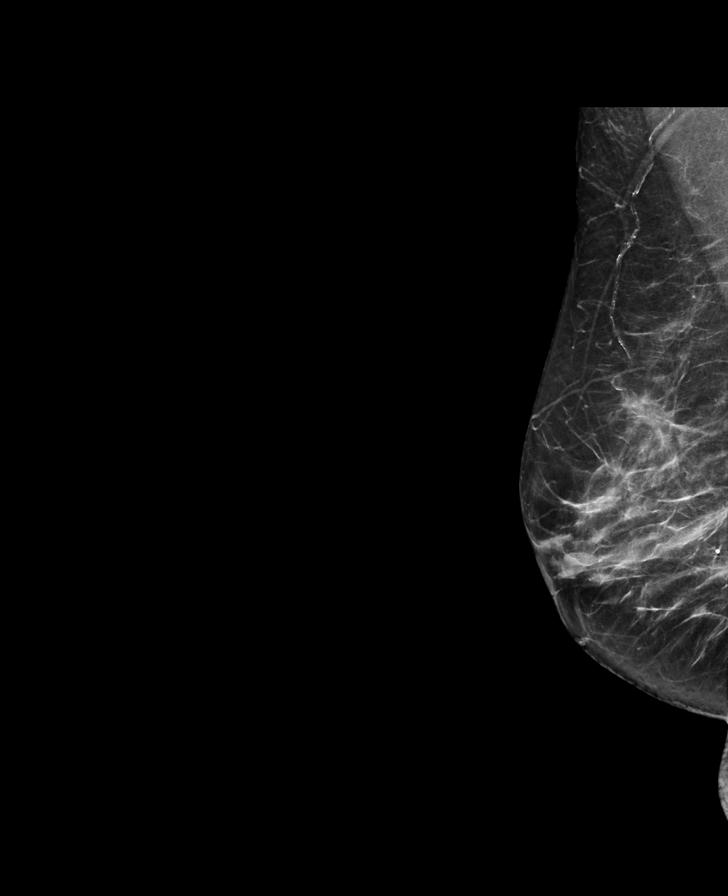

[L CC synth-2D]
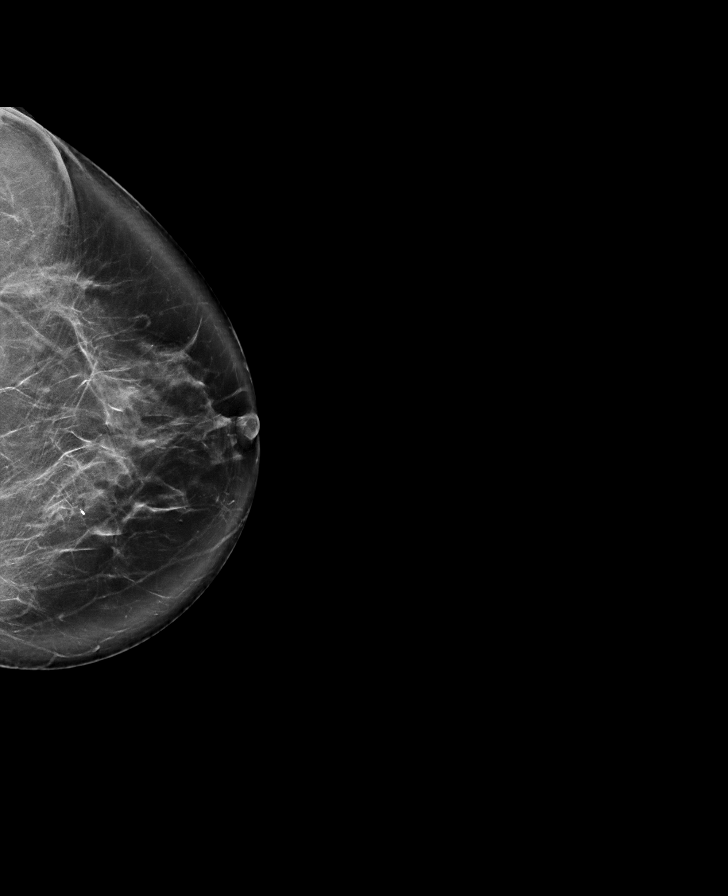

[R CC synth-2D]
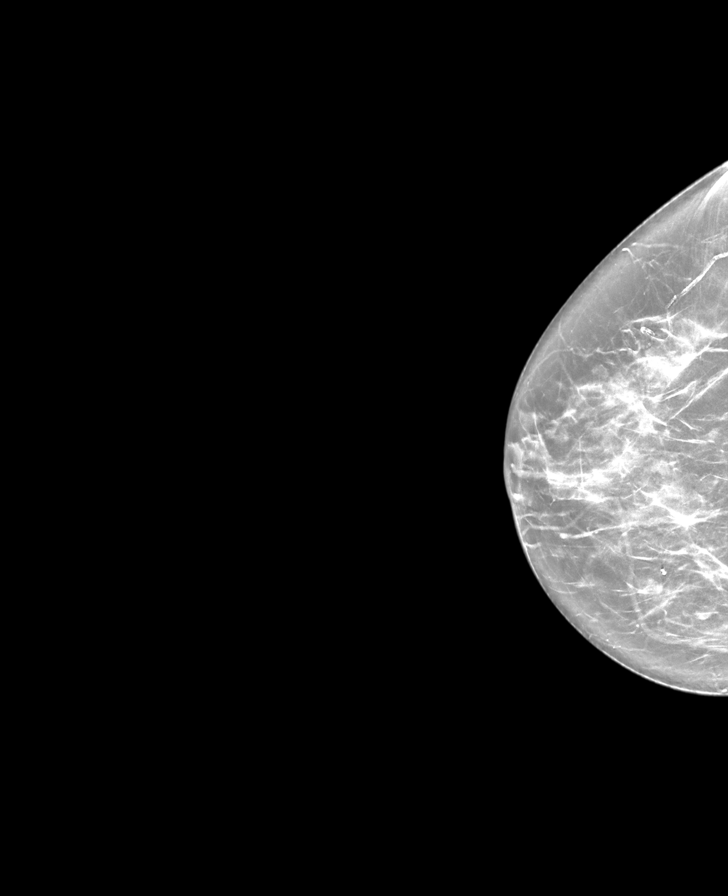

[R MLO tomo · tomo slice 33/66.0]
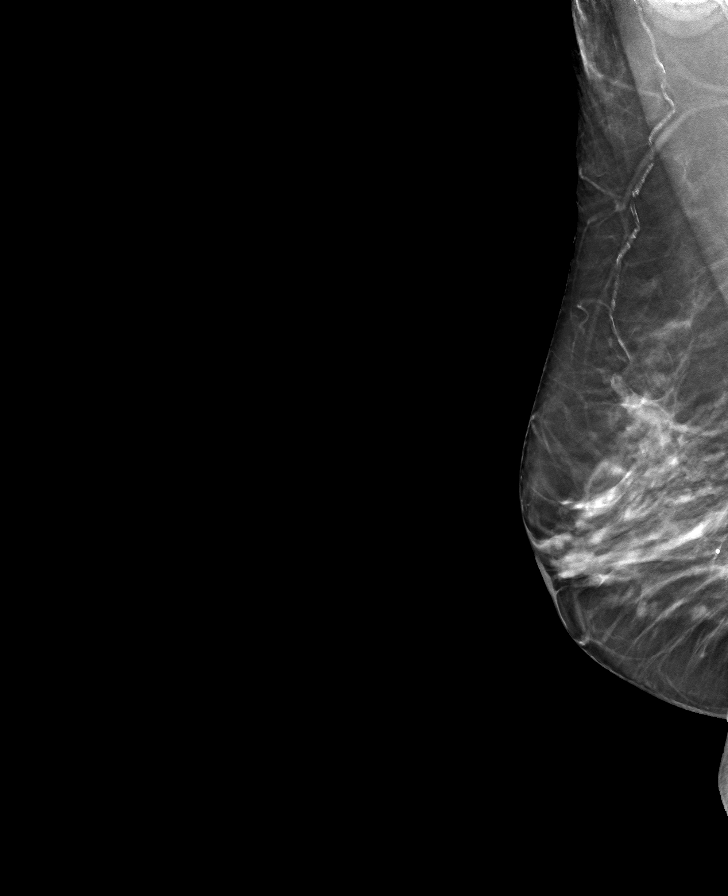

[L MLO tomo · tomo slice 35/70.0]
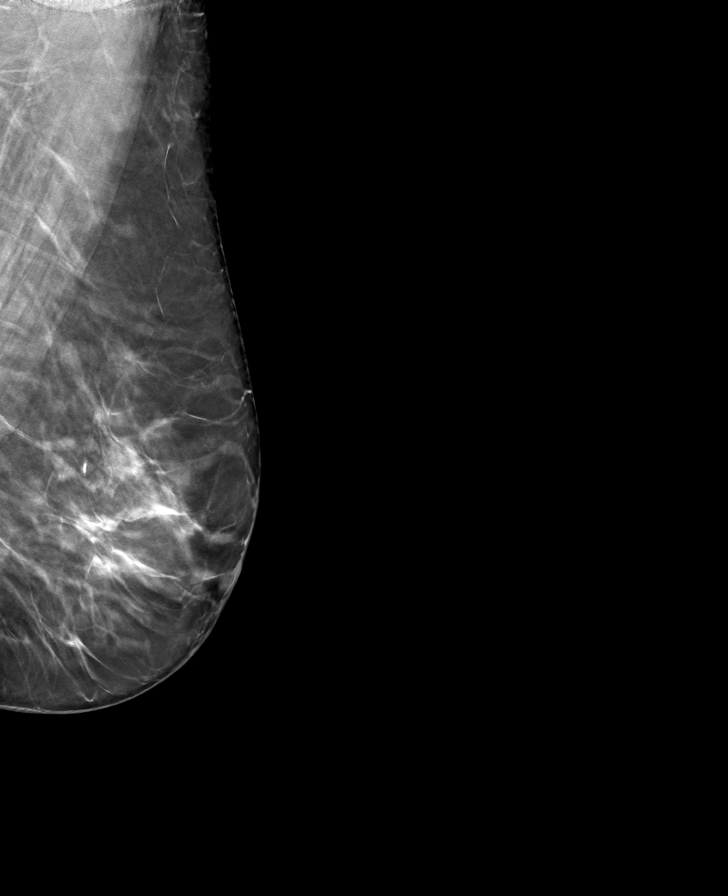

[L CC tomo · tomo slice 41/82.0]
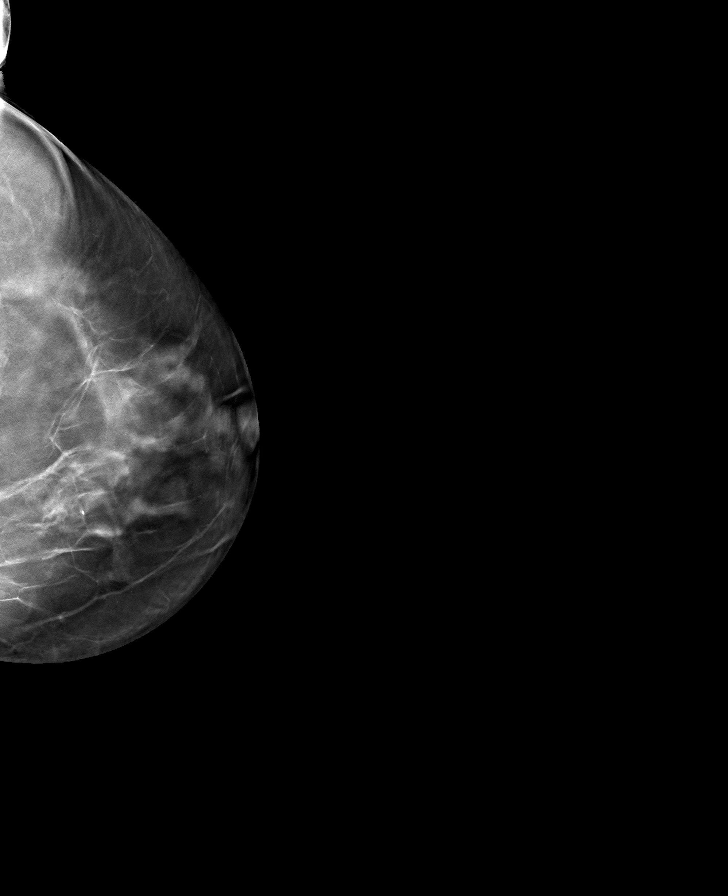

[R CC tomo · tomo slice 33/64.0]
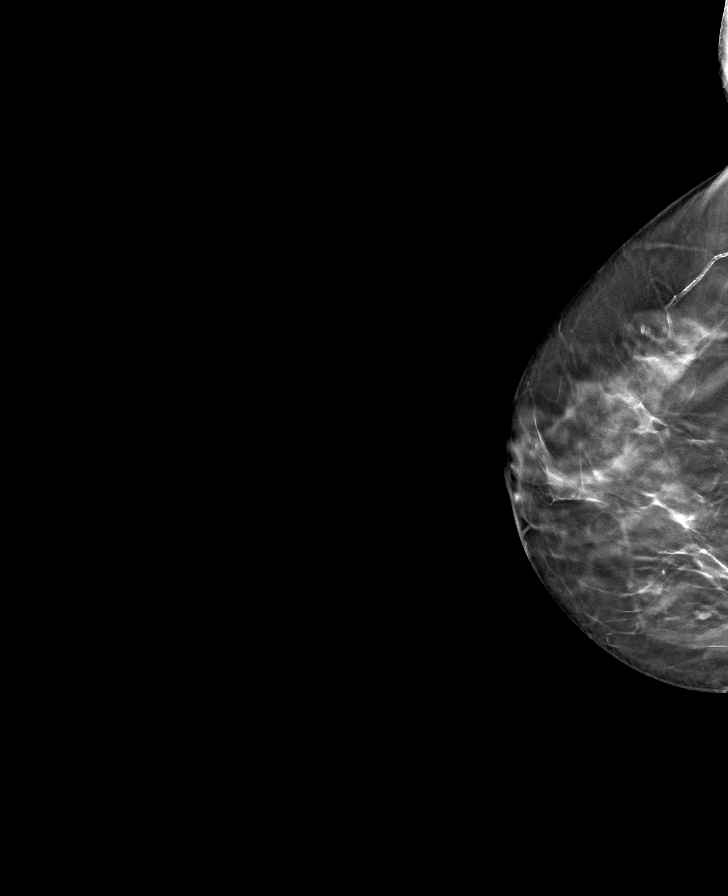

[8 of 24 positions shown; findings below may reference images not displayed]

ACR Breast Density Category c: The breast tissue is heterogeneously
dense, which may obscure small masses.
FINDINGS: There are no findings suspicious for malignancy.
IMPRESSION: No mammographic evidence of malignancy. A result letter of this
screening mammogram will be mailed directly to the patient.

RECOMMENDATION:
Screening mammogram in one year. (Code:Q3-W-BC3)

BI-RADS CATEGORY  1: Negative.

## 2023-04-28 ENCOUNTER — Ambulatory Visit: Payer: BLUE CROSS/BLUE SHIELD | Admitting: Gynecologic Oncology

## 2023-05-20 ENCOUNTER — Other Ambulatory Visit: Payer: Self-pay | Admitting: Physician Assistant

## 2023-05-20 DIAGNOSIS — I10 Essential (primary) hypertension: Secondary | ICD-10-CM

## 2023-09-22 ENCOUNTER — Inpatient Hospital Stay: Payer: 59 | Attending: Gynecologic Oncology | Admitting: Gynecologic Oncology

## 2023-09-22 ENCOUNTER — Encounter: Payer: Self-pay | Admitting: Gynecologic Oncology

## 2023-09-22 VITALS — BP 161/84 | HR 79 | Temp 98.7°F | Resp 20 | Wt 118.0 lb

## 2023-09-22 DIAGNOSIS — L299 Pruritus, unspecified: Secondary | ICD-10-CM | POA: Insufficient documentation

## 2023-09-22 DIAGNOSIS — Z8541 Personal history of malignant neoplasm of cervix uteri: Secondary | ICD-10-CM | POA: Insufficient documentation

## 2023-09-22 DIAGNOSIS — Z9221 Personal history of antineoplastic chemotherapy: Secondary | ICD-10-CM | POA: Insufficient documentation

## 2023-09-22 DIAGNOSIS — N952 Postmenopausal atrophic vaginitis: Secondary | ICD-10-CM | POA: Insufficient documentation

## 2023-09-22 DIAGNOSIS — C531 Malignant neoplasm of exocervix: Secondary | ICD-10-CM

## 2023-09-22 DIAGNOSIS — Z923 Personal history of irradiation: Secondary | ICD-10-CM | POA: Insufficient documentation

## 2023-09-22 MED ORDER — TRIAMCINOLONE ACETONIDE 0.025 % EX OINT: 1.0000 | TOPICAL_OINTMENT | Freq: Two times a day (BID) | CUTANEOUS | 0 refills | Status: DC | PRN

## 2023-09-22 NOTE — Progress Notes (Signed)
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 She was treated at [facility] in [place].    Biopsies on 07/02/2019: EGD shows at least CIN-3, invasive basal carcinoma cannot be excluded.  Endometrial biopsy shows at least CIN-3.  Endometrium noted.  P16 staining is positive. On exam in late October, 2020, vaginal involvement was noted. Biopsies on 08/16/2019: D&C shows moderately to poorly differentiated keratinizing squamous cell carcinoma.  Normal endometrial mucosa not identified.  Endocervical curettage shows at least fragments of malignant/dysplastic squamous cells.  Cervical biopsy shows fragments of squamous mucosa with foci of invasive moderately differentiated squamous cell carcinoma.  LEEP shows invasive moderately to poorly differentiated squamous cell carcinoma, extensive.  Inked margins positive for tumor.  Posterior cervix, LEEP: Again show squamous cell carcinoma, tumor involves all fragments extensively. PET scan on 08/30/2019: Intense FDG metabolism of the cervix with mildly increased metabolism within the vaginal vault.  No distant metastatic disease. Radiation treatment summary: 38 Gray to the cervix and pelvic lymph nodes, 26 fractions, dose per fraction 1.8 Gray.  Patient then received HDR with T&O, total dose of 30 Gray, 6 fractions. Completed radiation on 11/21/2019.   Seen by radiation oncology on 03/05/21. She was NED at that time on exam. CT C/A/P from 03/03/21 revealed no metastatic disease. Seen 08/2021. Continued to be NED at that time, some vaginal stenosis. CT C/A/P 08/31/21 revealed no evidence of disease.    CT A/P on 04/22/22: 1. No acute intra-abdominal pathology identified. No evidence of recurrent or residual disease within the abdomen and  pelvis. 2. 14 mm indeterminate left adrenal nodule. Comparison with prior examinations, if available, is recommended. If none are available, this may be better assessed with dedicated adrenal mass protocol CT or MRI examination.  Developed vaginal spotting, started on vaginal estrogen.  CT A/P on 11/10/2022: 1. No evidence of metastatic disease in the abdomen or pelvis. 2. Stable small uterus. No discrete uterine cervix mass. 3. Stable 1.2 cm left adrenal nodule with indeterminate density, statistically most likely a benign adenoma, which can be reassessed on routine future follow-up CT imaging. 4. Aortic Atherosclerosis (ICD10-I70.0).  Interval History: Doing well.  Reports significant decrease in vaginal spotting since using vaginal estrogen.  Last occurrence was 20 days ago when she voided.  Denies any pelvic pain.  Reports baseline bowel bladder function.  Endorses a good appetite.  Has occasional itching at the site of her incision where her port was removed.  Past Medical/Surgical History: Past Medical History:  Diagnosis Date   Cancer (HCC) 2022   Cervical   History of radiation therapy    Hypertension    Personal history of chemotherapy     Past Surgical History:  Procedure Laterality Date   COLONOSCOPY WITH PROPOFOL N/A 04/19/2022   Procedure: COLONOSCOPY WITH PROPOFOL;  Surgeon: Wyline Mood, MD;  Location: [facility] ENDOSCOPY;  Service: Gastroenterology;  Laterality: N/A;   HYSTEROSCOPY  08/2019   with cervical conization and D&C   INSERTION CENTRAL VENOUS ACCESS DEVICE W/ SUBCUTANEOUS PORT  09/2019   PORT-A-CATH REMOVAL     2021    Family History  Problem Relation Age of Onset   Liver cancer Mother    Breast cancer Neg Hx    Colon cancer Neg Hx  Ovarian cancer Neg Hx    Endometrial cancer Neg Hx    Pancreatic cancer Neg Hx    Prostate cancer Neg Hx     Social History   Socioeconomic History   Marital status: Married    Spouse name: Not on file   Number of  children: Not on file   Years of education: Not on file   Highest education level: Not on file  Occupational History   Occupation: homemaker  Tobacco Use   Smoking status: Never   Smokeless tobacco: Never  Vaping Use   Vaping status: Never Used  Substance and Sexual Activity   Alcohol use: Never   Drug use: Never   Sexual activity: Not Currently  Other Topics Concern   Not on file  Social History Narrative   Not on file   Social Determinants of Health   Financial Resource Strain: Not on file  Food Insecurity: Not on file  Transportation Needs: Not on file  Physical Activity: Not on file  Stress: Not on file  Social Connections: Not on file    Current Medications:  Current Outpatient Medications:    estradiol (ESTRACE VAGINAL) 0.1 MG/GM vaginal cream, Apply dime size amount on finger and apply to the vulva and at entrance of the vagina nightly for 2 weeks then three times weekly, Disp: 42.5 g, Rfl: 3   losartan (COZAAR) 50 MG tablet, TAKE 1 TABLET BY MOUTH DAILY, Disp: 30 tablet, Rfl: 11   meloxicam (MOBIC) 7.5 MG tablet, TAKE 1 TABLET BY MOUTH EVERY DAY, Disp: 30 tablet, Rfl: 2   rosuvastatin (CRESTOR) 5 MG tablet, Take 1 tablet by mouth at night twice per week, Disp: 30 tablet, Rfl: 3   triamcinolone (KENALOG) 0.025 % ointment, Apply 1 Application topically 2 (two) times daily as needed. As needed for itching at chest incision, Disp: 30 g, Rfl: 0  Review of Systems: + joint pain, itching Denies appetite changes, fevers, chills, fatigue, unexplained weight changes. Denies hearing loss, neck lumps or masses, mouth sores, ringing in ears or voice changes. Denies cough or wheezing.  Denies shortness of breath. Denies chest pain or palpitations. Denies leg swelling. Denies abdominal distention, pain, blood in stools, constipation, diarrhea, nausea, vomiting, or early satiety. Denies pain with intercourse, dysuria, frequency, hematuria or incontinence. Denies hot flashes,  pelvic pain, vaginal bleeding or vaginal discharge.   Denies back pain or muscle pain/cramps. Denies rash or wounds. Denies dizziness, headaches, numbness or seizures. Denies swollen lymph nodes or glands, denies easy bruising or bleeding. Denies anxiety, depression, confusion, or decreased concentration.  Physical Exam: BP (!) 161/84 (BP Location: Left Arm, Patient Position: Sitting) Comment: Rechecked notified RN  Pulse 79   Temp 98.7 F (37.1 C) (Oral)   Resp 20   Wt 118 lb (53.5 kg)   SpO2 100%   BMI 21.71 kg/m  General: Alert, oriented, no acute distress. HEENT: Normocephalic, atraumatic, sclera anicteric. Chest: Clear to auscultation bilaterally.  No wheezes or rhonchi.  Prominent, keloid appearing scar over right chest. Cardiovascular: Regular rate and rhythm, no murmurs. Abdomen: soft, nontender.  Normoactive bowel sounds.  No masses or hepatosplenomegaly appreciated.   Extremities: Grossly normal range of motion.  Warm, well perfused.  No edema bilaterally. Skin: No rashes or lesions noted. Lymphatics: No cervical, supraclavicular, or inguinal adenopathy. GU: Normal appearing external genitalia without erythema, excoriation, or lesions.  Atrophy and radiation changes noted along the periurethral tissue, likely source of patient's scant intermittent bleeding.  No active bleeding noted.  Distal vagina inspected without lesions noted.  Patient only has a vaginal length of approximately 1-2 cm.  On bimanual exam, vaginal length is only 1-2 cm.  No nodularity or masses palpated.  Rectal exam, no nodularity or masses palpated.  Laboratory & Radiologic Studies: None new  Assessment & Plan: Kathryn Robbins is a 57 y.o. woman with a history of locally advanced squamous cell carcinoma of the cervix who completed definitive treatment with chemoradiation in 11/2019 here for surveillance visit.  Last vaginal pap NIML, HR HPV negative, 03/2023. Imaging in 10/2022 negative.   Patient is  overall doing well and is NED on exam today.     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  She is being followed with every 6 months imaging prior to moving to [place].  On exam, I cannot see the apex of the vagina/cervix nor palpated.  On rectal exam, I do not palpate any masses.  Her last CT scan was in 10/2022.  Plan for repeat CT scan which was scheduled today.   Vaginal Pap and HPV testing were performed at her last visit, due in 02/2024.   Per NCCN surveillance recommendations, we will continue with surveillance visits every 6 months until 5 years after completion of treatment, which will be in 10/2024.  We reviewed signs and symptoms that would be concerning for cancer recurrence, and I stressed the need of calling my office to be seen sooner if she develops any of these symptoms.   Continue vaginal estrogen for atrophy.  Prescription sent for low-dose topical steroid for her to use as needed for itching at chest incision where port was removed.  Also discussed using neomycin if this does not help.  22 minutes of total time was spent for this patient encounter, including preparation, face-to-face counseling with the patient and coordination of care, and documentation of the encounter.  Eugene Garnet, MD  Division of Gynecologic Oncology  Department of Obstetrics and Gynecology  [facility]

## 2023-09-22 NOTE — Patient Instructions (Addendum)
It was good to see you today.  I do not see or feel any evidence of cancer recurrence on your exam.  We will see you for follow-up in 6 months.  I am sending in a steroid cream that you can use for itching along the incision where your port was removed.  You can also try some neomycin on this area.  As always, if you develop any new and concerning symptoms before your next visit, please call to see me sooner.

## 2023-09-29 ENCOUNTER — Ambulatory Visit: Payer: 59

## 2023-10-04 ENCOUNTER — Ambulatory Visit
Admission: RE | Admit: 2023-10-04 | Discharge: 2023-10-04 | Disposition: A | Discharge status: Home / Self Care | Payer: 59 | Source: Ambulatory Visit | Attending: Gynecologic Oncology | Admitting: Gynecologic Oncology

## 2023-10-04 DIAGNOSIS — C531 Malignant neoplasm of exocervix: Secondary | ICD-10-CM | POA: Diagnosis not present

## 2023-10-04 MED ORDER — IOHEXOL 300 MG/ML  SOLN
80.0000 mL | Freq: Once | INTRAMUSCULAR | Status: AC | PRN
  Administered 2023-10-04: 80 mL via INTRAVENOUS

## 2023-10-10 ENCOUNTER — Telehealth: Payer: Self-pay | Admitting: *Deleted

## 2023-10-10 NOTE — Telephone Encounter (Signed)
-----   Message from Carver Fila sent at 10/10/2023 11:44 AM EST ----- Would you please call the patient and pass along message I sent about her CT results ( no evidence of cancer recurrence)? Thank you

## 2023-10-10 NOTE — Progress Notes (Signed)
Would you please call the patient and pass along message I sent about her CT results ( no evidence of cancer recurrence)? Thank you

## 2023-10-10 NOTE — Telephone Encounter (Signed)
Attempted to reach Kathryn Robbins in regards to message from Dr. Pricilla Holm to relay CT scan results through [facility] interpreter # [phone]. Left voicemail requesting call back.

## 2023-10-11 NOTE — Telephone Encounter (Signed)
-----   Message from Carver Fila sent at 10/10/2023 11:44 AM EST ----- Would you please call the patient and pass along message I sent about her CT results ( no evidence of cancer recurrence)? Thank you

## 2023-10-11 NOTE — Telephone Encounter (Signed)
2nd attempt to reach patient to relay message from Dr. Pricilla Holm in regards to CT scan results through [facility] interpreter id # [phone]. Left voicemail requesting call back to [phone].

## 2023-10-12 NOTE — Telephone Encounter (Signed)
-----   Message from Carver Fila sent at 10/10/2023 11:44 AM EST ----- Would you please call the patient and pass along message I sent about her CT results ( no evidence of cancer recurrence)? Thank you

## 2023-10-12 NOTE — Telephone Encounter (Signed)
Spoke with Patient's son Eula Kelsall with assistance of [employer]preter id # [phone] and relayed message from Dr. Pricilla Holm that patient's CT scan results show no evidence of cancer recurrence. Mr. Manalac thanked the office for calling and will relay message to his mother.

## 2024-03-19 ENCOUNTER — Telehealth: Payer: Self-pay

## 2024-03-19 ENCOUNTER — Inpatient Hospital Stay: Payer: 59 | Attending: Gynecologic Oncology | Admitting: Gynecologic Oncology

## 2024-03-19 VITALS — BP 154/69 | HR 69 | Temp 98.4°F | Resp 14 | Wt 112.0 lb

## 2024-03-19 DIAGNOSIS — Z08 Encounter for follow-up examination after completed treatment for malignant neoplasm: Secondary | ICD-10-CM | POA: Diagnosis not present

## 2024-03-19 DIAGNOSIS — Z923 Personal history of irradiation: Secondary | ICD-10-CM | POA: Diagnosis not present

## 2024-03-19 DIAGNOSIS — Z1151 Encounter for screening for human papillomavirus (HPV): Secondary | ICD-10-CM | POA: Diagnosis not present

## 2024-03-19 DIAGNOSIS — N905 Atrophy of vulva: Secondary | ICD-10-CM | POA: Diagnosis not present

## 2024-03-19 DIAGNOSIS — C531 Malignant neoplasm of exocervix: Secondary | ICD-10-CM

## 2024-03-19 DIAGNOSIS — N952 Postmenopausal atrophic vaginitis: Secondary | ICD-10-CM

## 2024-03-19 DIAGNOSIS — Z8541 Personal history of malignant neoplasm of cervix uteri: Secondary | ICD-10-CM | POA: Diagnosis not present

## 2024-03-19 DIAGNOSIS — Z9221 Personal history of antineoplastic chemotherapy: Secondary | ICD-10-CM | POA: Diagnosis not present

## 2024-03-19 DIAGNOSIS — Z79890 Hormone replacement therapy (postmenopausal): Secondary | ICD-10-CM | POA: Diagnosis not present

## 2024-03-19 MED ORDER — ESTRADIOL 0.1 MG/GM VA CREA: TOPICAL_CREAM | VAGINAL | 3 refills | Status: AC

## 2024-03-19 NOTE — Patient Instructions (Signed)
 No concerning findings on today's exam. We ran a test for HPV and will call you with the results.  Plan on using the topical estrogen cream two to three times at bedtime to assist with healing/health of the vagina. You will need to use a fingertip size amount of cream on your finger and place slightly past the vaginal entrance at bedtime. Do not use the applicator if this comes with the cream.    We will plan on seeing you in the office in six months or sooner if needed. Please call for any questions or concerns or new symptoms.   Symptoms to report to your health care team include vaginal bleeding, rectal bleeding, bloating, weight loss without effort, new and persistent pain, new and  persistent fatigue, new leg swelling, new masses (i.e., bumps in your neck or groin), new and persistent cough, new and persistent nausea and vomiting, change in bowel or bladder habits, and any other concerns.

## 2024-03-19 NOTE — Telephone Encounter (Signed)
 Office received a PA request from CoverMyMeds on Estradiol  0.1mg . Requested information was provided and sent to insurance plan.

## 2024-03-20 NOTE — Progress Notes (Signed)
 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 Biopsies on 07/02/2019 showed at least CIN-3, invasive carcinoma cannot be excluded.  Endometrial biopsy shows at least CIN-3.  Endometrium noted.  P16 staining is positive. On exam in late October, 2020, vaginal involvement was noted. Biopsies on 08/14/2019: D&C shows moderately to poorly differentiated keratinizing squamous cell carcinoma.  Normal endometrial mucosa not identified.  Endocervical curettage shows at least fragments of malignant/dysplastic squamous cells.  Cervical biopsy shows fragments of squamous mucosa with foci of invasive moderately differentiated squamous cell carcinoma.  LEEP shows invasive moderately to poorly differentiated squamous cell carcinoma, extensive.  Inked margins positive for tumor.  Posterior cervix, LEEP: Again showed squamous cell carcinoma, tumor involves all fragments extensively. PET scan on 08/30/2019: Intense FDG metabolism of the cervix with mildly increased metabolism within the vaginal vault.  No distant metastatic disease. Radiation treatment summary: 65 Gray to the cervix and pelvic lymph nodes, 26 fractions, dose per fraction 1.8 Gray.  Patient then received HDR with T&O, total dose of 30 Gray, 6 fractions. She completed radiation on 11/21/2019.   She was seen by radiation oncology on 03/05/21 and was NED at that time on exam. CT C/A/P from 03/03/21 revealed no metastatic disease. She was seen again in 08/2021 and continued to be NED at that time, some vaginal stenosis. CT C/A/P 08/31/21 revealed no evidence of disease. The patient reported moving from [place]  in November last year to join family in the area.     Her last office check up with Dr. Orvil Bland was on 09/22/2023 with NED on exam. She underwent a CT scan on 10/04/2023 as a surveillance exam with no evidence of metastatic disease in the abdomen or pelvis, stable small uterus with no discrete uterine cervix mass, stable 11 mm left adrenal nodule felt to be a benign adenoma,. Left adrenal nodularity seen on previous scans from [place] .   Interval History: She presents to the office today with her family for cervical cancer follow up. Patient reports doing well overall since her last visit.  She has noticed spotting after urination intermittently, on April 11, April 18, and yesterday. The spotting resolves quickly. No other concerns voiced.  Tolerating diet with no nausea or emesis.  No abdominal pain reported.  Emptying bladder completely.  Bowels moving normally. No fever or chills. She reports using the vaginal estrogen in the past but has ran out of the cream with need for a new script. Interpreter used.  She was recently in [place]. She states the trip went well. She did see the GYN ONC, Dr. Kalpana Kothari on 02/26/2024 for a surveillance exam. At that time, she had a pap smear that returned with "negative for intraepithelial lesion or malignancy, non -neoplastic cellular variations of squamous metaplasia." No HPV testing noted on the report. She also had UA and culture on 02/27/2024 with no bacterial pathogen isolated. UA with "pus cells, RBCs 18-20 (range 0-2), and epithelial cells." She also underwent a MRI of the abdomen and pelvis with contrast with impression:  "Post treatment fibrosis involving anterior lip of cervix. No evidence of recurrent lesion. Clinical correlation suggested. Minimal free fluid in pouch of douglas. No evidence of abdominopelvic lymphadenopathy." Chest  xray was performed on 02/26/24 returning with "no significant abnormality." Mammogram was performed with no abnormalities noted.   Review of Systems: Positive for spotting seen  after urination, lower back pain which she relates to heavy lifting at work. Additional review is negative.  Current Meds:  Outpatient Encounter Medications as of 03/19/2024  Medication Sig   losartan  (COZAAR ) 50 MG tablet TAKE 1 TABLET BY MOUTH DAILY   [DISCONTINUED] estradiol  (ESTRACE  VAGINAL) 0.1 MG/GM vaginal cream Apply dime size amount on finger and apply to the vulva and at entrance of the vagina nightly for 2 weeks then three times weekly   estradiol  (ESTRACE  VAGINAL) 0.1 MG/GM vaginal cream Apply dime size amount on finger and apply to the vulva and at entrance of the vagina three times weekly   [DISCONTINUED] meloxicam  (MOBIC ) 7.5 MG tablet TAKE 1 TABLET BY MOUTH EVERY DAY   [DISCONTINUED] rosuvastatin  (CRESTOR ) 5 MG tablet Take 1 tablet by mouth at night twice per week   [DISCONTINUED] triamcinolone  (KENALOG ) 0.025 % ointment Apply 1 Application topically 2 (two) times daily as needed. As needed for itching at chest incision   No facility-administered encounter medications on file as of 03/19/2024.    Allergy:  Allergies  Allergen Reactions   Cefadroxil Itching   Keflex [Cephalexin] Rash    Social Hx:   Social History   Socioeconomic History   Marital status: Married    Spouse name: Not on file   Number of children: Not on file   Years of education: Not on file   Highest education level: Not on file  Occupational History   Occupation: homemaker  Tobacco Use   Smoking status: Never   Smokeless tobacco: Never  Vaping Use   Vaping status: Never Used  Substance and Sexual Activity   Alcohol use: Never   Drug use: Never   Sexual activity: Not Currently  Other Topics Concern   Not on file  Social History Narrative   Not on file   Social Drivers of Health   Financial Resource Strain: Not on file  Food Insecurity: Not on file  Transportation Needs: Not on file  Physical Activity: Not on file  Stress: Not on file  Social Connections: Not on file  Intimate  Partner Violence: Not on file    Past Surgical Hx:  Past Surgical History:  Procedure Laterality Date   COLONOSCOPY WITH PROPOFOL  N/A 04/19/2022   Procedure: COLONOSCOPY WITH PROPOFOL ;  Surgeon: Luke Salaam, MD;  Location: [facility] ENDOSCOPY;  Service: Gastroenterology;  Laterality: N/A;   HYSTEROSCOPY  08/2019   with cervical conization and D&C   INSERTION CENTRAL VENOUS ACCESS DEVICE W/ SUBCUTANEOUS PORT  09/2019   PORT-A-CATH REMOVAL     2021    Past Medical Hx:  Past Medical History:  Diagnosis Date   Cancer (HCC) 2022   Cervical   History of radiation therapy    Hypertension    Personal history of chemotherapy     Family Hx:  Family History  Problem Relation Age of Onset   Liver cancer Mother    Breast cancer Neg Hx    Colon cancer Neg Hx    Ovarian cancer Neg Hx    Endometrial cancer Neg Hx    Pancreatic cancer Neg Hx    Prostate cancer Neg Hx     Vitals:  Blood pressure (!) 154/69, pulse 69, temperature 98.4 F (36.9 C), temperature source Oral, resp. rate 14, weight 112 lb (50.8 kg), SpO2 100%.  Exam: General: Well developed,  well nourished female in no acute distress. Alert and oriented x 3.  Neck: Supple without any enlargements.  Lymph node survey: No cervical, supraclavicular, or inguinal adenopathy.  Cardiovascular: Regular rate and rhythm. S1 and S2 normal.  Lungs: Clear to auscultation bilaterally. No wheezes/crackles/rhonchi noted.  Skin: No rashes or lesions present. Back: No CVA tenderness.  Abdomen: Abdomen soft, non-tender and non-obese. Active bowel sounds in all quadrants. No evidence of a fluid wave or abdominal masses.  Genitourinary:    Vulva/vagina: Normal external female genitalia. No lesions.    Urethra: No lesions or masses, radiation changes with atrophy around the urethra    Vagina: Normal appearing external genitalia without erythema, excoriation, or lesions.  Atrophy and radiation changes noted along the periurethral tissue. No active  bleeding or discharge noted.  Smallest speculum placed barely past the introitus.  ThinPrep swab obtained with cytobrush for HPV testing only since she had recent pap smear in [place]. On bimanual exam, vaginal length is only 1-2 cm.  No nodularity or masses palpated. Rectal exam: no nodularity or masses palpated.  Extremities: No bilateral cyanosis, edema, or clubbing.   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 No evidence of recurrence on today's exam. HPV testing performed today on vaginal sample. She will be contacted with the results. Recent MRI imaging from April 2025 in [place] negative for signs of recurrence. Peri care discussed including use of peri bottle. She is advised to use the vaginal estrogen cream three times a week at bedtime and new script sent for this. Reportable signs and symptoms reviewed. She is to follow up with Dr. Orvil Bland in six months or sooner if needed. See media for medical records and testing from recent visit in [place] with Dr. Robby Chime. All questions answered. In regards to her cholesterol levels and whether she should be taking crestor , she is advised to follow up with her PCP.  Suellyn Emory, NP 03/20/2024, 9:38 AM

## 2024-03-20 NOTE — Telephone Encounter (Signed)
 Notice of denial received from pt's insurance.   I spoke to pt's pharmacy and they state medication is ready for pick up at the cost of $31.   [employer] interpreter [phone], pt is aware of medication ready for pick up at price above. She states she will pick up today.

## 2024-03-28 LAB — CERVICOVAGINAL ANCILLARY ONLY
Comment: NEGATIVE
High risk HPV: NEGATIVE

## 2024-03-29 ENCOUNTER — Telehealth: Payer: Self-pay

## 2024-03-29 NOTE — Telephone Encounter (Signed)
 Per Vira Grieves NP, using [address] [place] ID# [phone], LVM for Ms.Wong to call office for pap smear results as reported by Vira Grieves.

## 2024-03-29 NOTE — Telephone Encounter (Signed)
-----   Message from Suellyn Emory sent at 03/29/2024  8:46 AM EDT ----- Please let her know the HPV high risk testing we did at her office visit is not detected. Good news!

## 2024-04-01 ENCOUNTER — Telehealth: Payer: Self-pay | Admitting: *Deleted

## 2024-04-01 NOTE — Telephone Encounter (Signed)
 Spoke with patient's son Kathryn Robbins through [employer] interpreter [phone] and relayed message from Vira Grieves, NP that patient's HPV high risk testing we did at the office visit is not detected. Good News!   Kathryn Robbins states he will let his mother know of these results. And he states his mother is still experiencing very light pink spotting every few days, denies pelvic pain and or fever. Advised Kathryn Robbins to call the office if the spotting increases and if accompanied by other symptoms. Message relayed to providers, if any new recommendations office will call back. Pt's son verbalized understanding and thanked the office for calling.

## 2024-04-01 NOTE — Telephone Encounter (Signed)
 Spoke with patient's son and relayed message from Vira Grieves, NP that patient has thin tissue from previous radiation so she can have some light spotting. Nothing concerning on recent exam. Mr. Lubke states he will pass this information to his mother and thanked the office for calling back.

## 2024-04-01 NOTE — Telephone Encounter (Signed)
-----   Message from Suellyn Emory sent at 03/29/2024  8:46 AM EDT ----- Please let her know the HPV high risk testing we did at her office visit is not detected. Good news!

## 2024-04-11 ENCOUNTER — Encounter: Payer: Self-pay | Admitting: Physician Assistant

## 2024-04-11 ENCOUNTER — Ambulatory Visit (INDEPENDENT_AMBULATORY_CARE_PROVIDER_SITE_OTHER): Admitting: Physician Assistant

## 2024-04-11 ENCOUNTER — Telehealth: Payer: Self-pay | Admitting: *Deleted

## 2024-04-11 VITALS — BP 128/70 | HR 69 | Temp 97.8°F | Resp 16 | Ht 61.0 in | Wt 112.0 lb

## 2024-04-11 DIAGNOSIS — Z0001 Encounter for general adult medical examination with abnormal findings: Secondary | ICD-10-CM | POA: Diagnosis not present

## 2024-04-11 DIAGNOSIS — I10 Essential (primary) hypertension: Secondary | ICD-10-CM | POA: Diagnosis not present

## 2024-04-11 DIAGNOSIS — Z8541 Personal history of malignant neoplasm of cervix uteri: Secondary | ICD-10-CM | POA: Diagnosis not present

## 2024-04-11 MED ORDER — LOSARTAN POTASSIUM 50 MG PO TABS: 50.0000 mg | ORAL_TABLET | Freq: Every day | ORAL | 3 refills | Status: AC

## 2024-04-11 NOTE — Telephone Encounter (Signed)
 Spoke with patient's son and her daughter who called the office stating the patient is still having vaginal bleeding that is spotty, red without clots. Daughter states it's only two-three times a week and her mother is not wearing any pads, it's only on her underwear. Pt denies fever, chills and no pain. Pt reports lifting boxes that maybe contributing to the bleeding?  Patient is going to send a picture of the bleeding through MyChart. Advised patient's family to have her monitor the bleeding and the small amount she is having right now is due to her very thin vaginal tissue from past radiation treatment. Advised that if the bleeding increases and is accompanied by other symptoms to call the office back. Pt's daughter verbalized understanding. Message relayed to provider.

## 2024-04-11 NOTE — Progress Notes (Signed)
 [facility] [address] [place], [place] [address]  Internal MEDICINE  Office Visit Note  Patient Name: Kathryn Robbins  [id number]  [id number]  Date of Service: 04/11/2024  Chief Complaint  Patient presents with   Annual Exam   Hypertension     HPI Pt is here for routine health maintenance examination, her daughter is with her and assists with translating -BP stable, taking losartan  -followed by GYN ONC, still will have some bleeding and is worried about this, but has discussed this with them. Following up q48months -UTD on mammogram and colonoscopy -due for labs and will check urine with this as well--given lab slip  Current Medication: Outpatient Encounter Medications as of 04/11/2024  Medication Sig   estradiol  (ESTRACE  VAGINAL) 0.1 MG/GM vaginal cream Apply dime size amount on finger and apply to the vulva and at entrance of the vagina three times weekly   [DISCONTINUED] losartan  (COZAAR ) 50 MG tablet TAKE 1 TABLET BY MOUTH DAILY   losartan  (COZAAR ) 50 MG tablet Take 1 tablet (50 mg total) by mouth daily.   No facility-administered encounter medications on file as of 04/11/2024.    Surgical History: Past Surgical History:  Procedure Laterality Date   COLONOSCOPY WITH PROPOFOL  N/A 04/19/2022   Procedure: COLONOSCOPY WITH PROPOFOL ;  Surgeon: Luke Salaam, MD;  Location: [facility] ENDOSCOPY;  Service: Gastroenterology;  Laterality: N/A;   HYSTEROSCOPY  08/2019   with cervical conization and D&C   INSERTION CENTRAL VENOUS ACCESS DEVICE W/ SUBCUTANEOUS PORT  09/2019   PORT-A-CATH REMOVAL     2021    Medical History: Past Medical History:  Diagnosis Date   Cancer (HCC) 2022   Cervical   History of radiation therapy    Hypertension    Personal history of chemotherapy     Family History: Family History  Problem Relation Age of Onset   Liver cancer Mother    Breast cancer Neg Hx    Colon cancer Neg Hx    Ovarian cancer Neg Hx    Endometrial cancer  Neg Hx    Pancreatic cancer Neg Hx    Prostate cancer Neg Hx       Review of Systems  Constitutional:  Negative for chills, fatigue and unexpected weight change.  HENT:  Negative for congestion, rhinorrhea, sneezing and sore throat.   Eyes:  Negative for redness.  Respiratory:  Negative for cough, chest tightness and shortness of breath.   Cardiovascular:  Negative for chest pain and palpitations.  Gastrointestinal:  Negative for abdominal pain, constipation, diarrhea, nausea and vomiting.  Genitourinary:  Positive for vaginal bleeding. Negative for dysuria and frequency.  Musculoskeletal:  Negative for arthralgias, back pain, joint swelling and neck pain.  Skin:  Negative for rash.  Neurological: Negative.  Negative for tremors and numbness.  Hematological:  Negative for adenopathy. Does not bruise/bleed easily.  Psychiatric/Behavioral:  Negative for behavioral problems (Depression), sleep disturbance and suicidal ideas. The patient is nervous/anxious.      Vital Signs: BP 128/70   Pulse 69   Temp 97.8 F (36.6 C)   Resp 16   Ht 5' 1 (1.549 m)   Wt 112 lb (50.8 kg)   SpO2 97%   BMI 21.16 kg/m    Physical Exam Vitals and nursing note reviewed.  Constitutional:      General: She is not in acute distress.    Appearance: Normal appearance. She is well-developed and normal weight. She is not diaphoretic.  HENT:     Head: Normocephalic and  atraumatic.     Mouth/Throat:     Pharynx: No oropharyngeal exudate.   Eyes:     Extraocular Movements: Extraocular movements intact.   Neck:     Thyroid: No thyromegaly.     Vascular: No JVD.     Trachea: No tracheal deviation.   Cardiovascular:     Rate and Rhythm: Normal rate and regular rhythm.     Heart sounds: Normal heart sounds. No murmur heard.    No friction rub. No gallop.  Pulmonary:     Effort: Pulmonary effort is normal. No respiratory distress.     Breath sounds: No wheezing or rales.  Chest:     Chest wall:  No tenderness.  Breasts:    Right: Normal. No mass.     Left: Normal. No mass.  Abdominal:     General: Bowel sounds are normal.     Palpations: Abdomen is soft.     Tenderness: There is no abdominal tenderness.   Musculoskeletal:        General: Normal range of motion.     Cervical back: Normal range of motion and neck supple.   Skin:    General: Skin is warm and dry.   Neurological:     Mental Status: She is alert and oriented to person, place, and time.   Psychiatric:        Behavior: Behavior normal.        Thought Content: Thought content normal.        Judgment: Judgment normal.      LABS: Recent Results (from the past 2160 hours)  Cervicovaginal ancillary only     Status: None   Collection Time: 03/19/24 12:02 PM  Result Value Ref Range   High risk HPV Negative    Comment Normal Reference Range HPV - Negative         Assessment/Plan: 1. Encounter for general adult medical examination with abnormal findings (Primary) CPE performed, due for labs--slip given, UTD on colonoscopy and mammogram  2. Essential hypertension Stable, continue losartan  as before - losartan  (COZAAR ) 50 MG tablet; Take 1 tablet (50 mg total) by mouth daily.  Dispense: 90 tablet; Refill: 3  3. History of cervical cancer Followed by GYN ONC and plans to discuss continued vaginal spotting/bleeding with them   General Counseling: Gerica verbalizes understanding of the findings of todays visit and agrees with plan of treatment. I have discussed any further diagnostic evaluation that may be needed or ordered today. We also reviewed her medications today. she has been encouraged to call the office with any questions or concerns that should arise related to todays visit.    Counseling:    No orders of the defined types were placed in this encounter.   Meds ordered this encounter  Medications   losartan  (COZAAR ) 50 MG tablet    Sig: Take 1 tablet (50 mg total) by mouth daily.     Dispense:  90 tablet    Refill:  3    This patient was seen by Taylor Favia, PA-C in collaboration with Dr. Verneta Gone as a part of collaborative care agreement.  Total time spent:35 Minutes  Time spent includes review of chart, medications, test results, and follow up plan with the patient.     Lawton Price, MD  Internal Medicine

## 2024-04-11 NOTE — Telephone Encounter (Signed)
 Spoke with patient's daughter Kathryn Robbins and based on the pictures that were sent in MyChart -1st picture was from Monday and the second was from another day this week? Pt is only experiencing spotting today. An appointment was made for patient with Vira Grieves, NP on Monday, June 16 th. At 1pm. Nipa agreed to date and time.

## 2024-04-13 ENCOUNTER — Other Ambulatory Visit
Admission: RE | Admit: 2024-04-13 | Discharge: 2024-04-13 | Disposition: A | Discharge status: Home / Self Care | Attending: Physician Assistant | Admitting: Physician Assistant

## 2024-04-13 DIAGNOSIS — E559 Vitamin D deficiency, unspecified: Secondary | ICD-10-CM | POA: Diagnosis present

## 2024-04-13 DIAGNOSIS — K30 Functional dyspepsia: Secondary | ICD-10-CM | POA: Diagnosis not present

## 2024-04-13 DIAGNOSIS — E782 Mixed hyperlipidemia: Secondary | ICD-10-CM | POA: Diagnosis not present

## 2024-04-13 DIAGNOSIS — R5383 Other fatigue: Secondary | ICD-10-CM | POA: Diagnosis not present

## 2024-04-13 DIAGNOSIS — E538 Deficiency of other specified B group vitamins: Secondary | ICD-10-CM | POA: Insufficient documentation

## 2024-04-13 DIAGNOSIS — Z1329 Encounter for screening for other suspected endocrine disorder: Secondary | ICD-10-CM | POA: Diagnosis not present

## 2024-04-13 LAB — URINALYSIS, W/ REFLEX TO CULTURE (INFECTION SUSPECTED)
Bilirubin Urine: NEGATIVE
Glucose, UA: NEGATIVE mg/dL
Hgb urine dipstick: NEGATIVE
Ketones, ur: NEGATIVE mg/dL
Nitrite: NEGATIVE
Protein, ur: 30 mg/dL — AB
Specific Gravity, Urine: 1.02 (ref 1.005–1.030)
pH: 5 (ref 5.0–8.0)

## 2024-04-13 LAB — COMPREHENSIVE METABOLIC PANEL WITH GFR
ALT: 19 U/L (ref 0–44)
AST: 17 U/L (ref 15–41)
Albumin: 3.8 g/dL (ref 3.5–5.0)
Alkaline Phosphatase: 85 U/L (ref 38–126)
Anion gap: 9 (ref 5–15)
BUN: 12 mg/dL (ref 6–20)
CO2: 25 mmol/L (ref 22–32)
Calcium: 9.3 mg/dL (ref 8.9–10.3)
Chloride: 106 mmol/L (ref 98–111)
Creatinine, Ser: 0.61 mg/dL (ref 0.44–1.00)
GFR, Estimated: >60 mL/min (ref >60)
Glucose, Bld: 99 mg/dL (ref 70–99)
Potassium: 3.8 mmol/L (ref 3.5–5.1)
Sodium: 140 mmol/L (ref 135–145)
Total Bilirubin: 1 mg/dL (ref 0.0–1.2)
Total Protein: 7.1 g/dL (ref 6.5–8.1)

## 2024-04-13 LAB — CBC WITH DIFFERENTIAL/PLATELET
Abs Immature Granulocytes: 0.02 10*3/uL (ref 0.00–0.07)
Basophils Absolute: 0.1 10*3/uL (ref 0.0–0.1)
Basophils Relative: 1 %
Eosinophils Absolute: 0.1 10*3/uL (ref 0.0–0.5)
Eosinophils Relative: 1 %
HCT: 40.2 % (ref 36.0–46.0)
Hemoglobin: 13.1 g/dL (ref 12.0–15.0)
Immature Granulocytes: 0 %
Lymphocytes Relative: 22 %
Lymphs Abs: 2 10*3/uL (ref 0.7–4.0)
MCH: 26.3 pg (ref 26.0–34.0)
MCHC: 32.6 g/dL (ref 30.0–36.0)
MCV: 80.7 fL (ref 80.0–100.0)
Monocytes Absolute: 0.5 10*3/uL (ref 0.1–1.0)
Monocytes Relative: 5 %
Neutro Abs: 6.3 10*3/uL (ref 1.7–7.7)
Neutrophils Relative %: 71 %
Platelets: 233 10*3/uL (ref 150–400)
RBC: 4.98 MIL/uL (ref 3.87–5.11)
RDW: 12.8 % (ref 11.5–15.5)
WBC: 8.9 10*3/uL (ref 4.0–10.5)
nRBC: 0 % (ref 0.0–0.2)

## 2024-04-13 LAB — IRON AND TIBC
Iron: 86 ug/dL (ref 28–170)
Saturation Ratios: 23 % (ref 10.4–31.8)
TIBC: 381 ug/dL (ref 250–450)
UIBC: 295 ug/dL

## 2024-04-13 LAB — FOLATE: Folate: 12.5 ng/mL (ref >5.9)

## 2024-04-13 LAB — VITAMIN D 25 HYDROXY (VIT D DEFICIENCY, FRACTURES): Vit D, 25-Hydroxy: 66.29 ng/mL (ref 30–100)

## 2024-04-13 LAB — LIPID PANEL
Cholesterol: 226 mg/dL — ABNORMAL HIGH (ref 0–200)
HDL: 70 mg/dL (ref >40)
LDL Cholesterol: 140 mg/dL — ABNORMAL HIGH (ref 0–99)
Total CHOL/HDL Ratio: 3.2 ratio
Triglycerides: 79 mg/dL (ref <150)
VLDL: 16 mg/dL (ref 0–40)

## 2024-04-13 LAB — VITAMIN B12: Vitamin B-12: 3093 pg/mL — ABNORMAL HIGH (ref 180–914)

## 2024-04-13 LAB — T4, FREE: Free T4: 0.81 ng/dL (ref 0.61–1.12)

## 2024-04-13 LAB — FERRITIN: Ferritin: 28 ng/mL (ref 11–307)

## 2024-04-13 LAB — TSH: TSH: 1.456 u[IU]/mL (ref 0.350–4.500)

## 2024-04-15 ENCOUNTER — Inpatient Hospital Stay

## 2024-04-15 ENCOUNTER — Other Ambulatory Visit: Payer: Self-pay | Admitting: Gynecologic Oncology

## 2024-04-15 ENCOUNTER — Inpatient Hospital Stay: Attending: Gynecologic Oncology | Admitting: Gynecologic Oncology

## 2024-04-15 ENCOUNTER — Ambulatory Visit: Payer: Self-pay | Admitting: Gynecologic Oncology

## 2024-04-15 ENCOUNTER — Telehealth: Payer: Self-pay | Admitting: *Deleted

## 2024-04-15 VITALS — BP 152/78 | HR 77 | Temp 98.5°F | Resp 16 | Wt 112.2 lb

## 2024-04-15 DIAGNOSIS — N905 Atrophy of vulva: Secondary | ICD-10-CM

## 2024-04-15 DIAGNOSIS — Z08 Encounter for follow-up examination after completed treatment for malignant neoplasm: Secondary | ICD-10-CM | POA: Diagnosis not present

## 2024-04-15 DIAGNOSIS — N920 Excessive and frequent menstruation with regular cycle: Secondary | ICD-10-CM

## 2024-04-15 DIAGNOSIS — Z923 Personal history of irradiation: Secondary | ICD-10-CM | POA: Diagnosis not present

## 2024-04-15 DIAGNOSIS — Z8541 Personal history of malignant neoplasm of cervix uteri: Secondary | ICD-10-CM | POA: Diagnosis not present

## 2024-04-15 DIAGNOSIS — Z9221 Personal history of antineoplastic chemotherapy: Secondary | ICD-10-CM | POA: Insufficient documentation

## 2024-04-15 DIAGNOSIS — N950 Postmenopausal bleeding: Secondary | ICD-10-CM

## 2024-04-15 DIAGNOSIS — R319 Hematuria, unspecified: Secondary | ICD-10-CM

## 2024-04-15 LAB — URINALYSIS, COMPLETE (UACMP) WITH MICROSCOPIC
Bacteria, UA: NONE SEEN
Bilirubin Urine: NEGATIVE
Glucose, UA: NEGATIVE mg/dL
Hgb urine dipstick: NEGATIVE
Ketones, ur: NEGATIVE mg/dL
Leukocytes,Ua: NEGATIVE
Nitrite: NEGATIVE
Protein, ur: NEGATIVE mg/dL
Specific Gravity, Urine: 1.014 (ref 1.005–1.030)
pH: 6 (ref 5.0–8.0)

## 2024-04-15 LAB — OCCULT BLOOD X 1 CARD TO LAB, STOOL: Fecal Occult Bld: NEGATIVE

## 2024-04-15 NOTE — Telephone Encounter (Signed)
 Attempted to reach patient through [facility] Interpreter Id [phone]. Left voicemail requesting call back.

## 2024-04-15 NOTE — Progress Notes (Signed)
 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 Biopsies on 07/02/2019 showed at least CIN-3, invasive carcinoma cannot be excluded.  Endometrial biopsy shows at least CIN-3.  Endometrium noted.  P16 staining is positive. On exam in late October, 2020, vaginal involvement was noted. Biopsies on 08/14/2019: D&C shows moderately to poorly differentiated keratinizing squamous cell carcinoma.  Normal endometrial mucosa not identified.  Endocervical curettage shows at least fragments of malignant/dysplastic squamous cells.  Cervical biopsy shows fragments of squamous mucosa with foci of invasive moderately differentiated squamous cell carcinoma.  LEEP shows invasive moderately to poorly differentiated squamous cell carcinoma, extensive.  Inked margins positive for tumor.  Posterior cervix, LEEP: Again showed squamous cell carcinoma, tumor involves all fragments extensively. PET scan on 08/30/2019: Intense FDG metabolism of the cervix with mildly increased metabolism within the vaginal vault.  No distant metastatic disease. Radiation treatment summary: 79 Gray to the cervix and pelvic lymph nodes, 26 fractions, dose per fraction 1.8 Gray. Patient then received HDR with T&O, total dose of 30 Gray, 6 fractions. She completed radiation on 11/21/2019.   She was seen by radiation oncology on 03/05/21 and was NED at that time on exam. CT C/A/P from 03/03/21 revealed no metastatic disease. She was seen again in 08/2021 and continued to be NED at that time, some vaginal stenosis. CT C/A/P 08/31/21 revealed no evidence of disease. The patient reported moving from [place]  in November last year to join family in the area.    She underwent a CT  scan on 10/04/2023 as a surveillance exam with no evidence of metastatic disease in the abdomen or pelvis, stable small uterus with no discrete uterine cervix mass, stable 11 mm left adrenal nodule felt to be a benign adenoma,. Left adrenal nodularity seen on previous scans from [place] . Her last office check up was on 02/2024 with NED on exam.   Interval History: Virtual interpreter used. She presents to the office today for evaluation of bright red blood noted on her peri pad intermittently. She states she has had no bleeding or spotting for the past 12 days. She feels well. Continues to tolerate diet with no nausea or emesis. No pain reported. Bowels and bladder functioning without difficulty. No rectal bleeding to her knowledge. Has been using the topical estrogen cream three times a week. She performed heavy lifting and strenuous activity at work and has noticed the bleeding on her peri pad after this. She says she may leave her job due to the strenuous nature and since they are only calling her in once a week.  While in [place] on a recent trip, she did see the GYN ONC, Dr. Kalpana Kothari on 02/26/2024 for a surveillance exam. At that time, she had a pap smear that returned with negative for intraepithelial lesion or malignancy, non -neoplastic cellular variations of squamous metaplasia. No HPV testing noted on the report. She also had UA and culture on 02/27/2024 with no bacterial pathogen isolated. UA with pus cells, RBCs 18-20 (range 0-2), and epithelial cells. She also underwent a MRI of the abdomen and pelvis with contrast with impression:  Post treatment fibrosis involving anterior lip of cervix. No evidence of recurrent lesion. Clinical correlation suggested. Minimal free fluid  in pouch of douglas. No evidence of abdominopelvic lymphadenopathy. Chest xray was performed on 02/26/24 returning with no significant abnormality. Mammogram was performed with no abnormalities noted.   Review of  Systems: Positive for bright red blood seen after urination, lower back pain which she relates to heavy lifting at work. Additional review is negative.  Current Meds:  Outpatient Encounter Medications as of 04/15/2024  Medication Sig   estradiol  (ESTRACE  VAGINAL) 0.1 MG/GM vaginal cream Apply dime size amount on finger and apply to the vulva and at entrance of the vagina three times weekly   losartan  (COZAAR ) 50 MG tablet Take 1 tablet (50 mg total) by mouth daily.   No facility-administered encounter medications on file as of 04/15/2024.    Allergy:  Allergies  Allergen Reactions   Cefadroxil Itching   Keflex [Cephalexin] Rash    Social Hx:   Social History   Socioeconomic History   Marital status: Married    Spouse name: Not on file   Number of children: Not on file   Years of education: Not on file   Highest education level: Not on file  Occupational History   Occupation: homemaker  Tobacco Use   Smoking status: Never   Smokeless tobacco: Never  Vaping Use   Vaping status: Never Used  Substance and Sexual Activity   Alcohol use: Never   Drug use: Never   Sexual activity: Not Currently  Other Topics Concern   Not on file  Social History Narrative   Not on file   Social Drivers of Health   Financial Resource Strain: Not on file  Food Insecurity: Not on file  Transportation Needs: Not on file  Physical Activity: Not on file  Stress: Not on file  Social Connections: Not on file  Intimate Partner Violence: Not on file    Past Surgical Hx:  Past Surgical History:  Procedure Laterality Date   COLONOSCOPY WITH PROPOFOL  N/A 04/19/2022   Procedure: COLONOSCOPY WITH PROPOFOL ;  Surgeon: Luke Salaam, MD;  Location: [facility] ENDOSCOPY;  Service: Gastroenterology;  Laterality: N/A;   HYSTEROSCOPY  08/2019   with cervical conization and D&C   INSERTION CENTRAL VENOUS ACCESS DEVICE W/ SUBCUTANEOUS PORT  09/2019   PORT-A-CATH REMOVAL     2021    Past Medical Hx:  Past  Medical History:  Diagnosis Date   Cancer (HCC) 2022   Cervical   History of radiation therapy    Hypertension    Personal history of chemotherapy     Family Hx:  Family History  Problem Relation Age of Onset   Liver cancer Mother    Breast cancer Neg Hx    Colon cancer Neg Hx    Ovarian cancer Neg Hx    Endometrial cancer Neg Hx    Pancreatic cancer Neg Hx    Prostate cancer Neg Hx     Vitals:  Blood pressure (!) 152/78, pulse 77, temperature 98.5 F (36.9 C), temperature source Oral, resp. rate 16, weight 112 lb 3.2 oz (50.9 kg), SpO2 100%.  Exam: General: Well developed, well nourished female in no acute distress. Alert and oriented x 3.  Lymph node survey: No inguinal adenopathy.  Cardiovascular: Regular rate and rhythm. S1 and S2 normal.  Lungs: Clear to auscultation bilaterally. No wheezes/crackles/rhonchi noted.  Skin: No rashes or lesions present. Abdomen: Abdomen soft, non-tender and non-obese. Active bowel sounds in all quadrants. No evidence of a fluid wave or abdominal masses.  Genitourinary:    Vulva/vagina: Normal external female genitalia.  No lesions.    Urethra: No lesions or masses, radiation changes with atrophy of peri-urethral tissue, more prominent blood vessels near urethra.    Vagina: Normal appearing external genitalia except for radiation changes without erythema, excoriation, or lesions. Agglutination of the vagina.  No active bleeding or discharge noted.  Rectal exam: no nodularity or masses palpated. Small amount of stool obtained for hemoccult.  Extremities: No bilateral cyanosis, edema, or clubbing.   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 No evidence of recurrence on today's exam. She has not had bleeding on her peri pad for the past 12 days. She had recent labs  including a CBC on 04/13/2024. UA obtained to test for hematuria. Rectal exam performed today for hemoccult testing. She will be contacted with the results. We discussed potential referral to urology or GI based on the results.  Recent MRI imaging from April 2025 in [place] negative for signs of recurrence. Peri care discussed including use of peri bottle. She is advised to use the vaginal estrogen cream three times a week at bedtime. Reportable signs and symptoms reviewed. She is to follow up with Dr. Orvil Bland as planned or sooner if needed. See media for medical records and testing from recent visit in [place] with Dr. Robby Chime. All questions answered.   Suellyn Emory, NP 04/15/2024, 2:21 PM

## 2024-04-15 NOTE — Patient Instructions (Addendum)
 No obvious sign of bleeding on today's exam. Near the hole where the urine comes out, the skin is thin from previous radiation and there is a blood vessel close to the surface. This may bleed if irritated or with heavy lifting.  We are testing the urine sample today for blood. If present, we will refer to you meet with a urologist to see if the bleeding is coming from the bladder. On the urine sample you had recently, there was no blood. We also took a small sample of stool today to test for blood. We will call you with the results.   Symptoms to report to your health care team include vaginal bleeding, rectal bleeding, bloating, weight loss without effort, new and persistent pain, new and  persistent fatigue, new leg swelling, new masses (i.e., bumps in your neck or groin), new and persistent cough, new and persistent nausea and vomiting, change in bowel or bladder habits, and any other concerns.

## 2024-04-16 ENCOUNTER — Telehealth: Payer: Self-pay

## 2024-04-16 NOTE — Telephone Encounter (Signed)
-----   Message from Suellyn Emory sent at 04/15/2024  4:11 PM EDT ----- Urine sample was normal. No blood seen. There was also no blood noted on hemoccult card (sample of stool from rectum). Would call for any new bleeding. Will update Dr. Orvil Bland and we will let her know of any new recs if any.

## 2024-04-16 NOTE — Telephone Encounter (Signed)
 [facility] interpreter Vita Grip [phone], LVM for Ms.Calef to call office regarding recent results as reported by Vira Grieves NP.

## 2024-04-17 ENCOUNTER — Telehealth: Payer: Self-pay

## 2024-04-17 NOTE — Telephone Encounter (Signed)
 [facility] [employer] Interpreter [phone], voicemail left for Ms.Mickler to call office for recent lab results.   MyChart message also sent with Message from Vira Grieves NP regarding lab results

## 2024-04-17 NOTE — Telephone Encounter (Signed)
 Lmom that to pt due to her son left message for labs result

## 2024-04-24 ENCOUNTER — Other Ambulatory Visit: Payer: Self-pay

## 2024-04-24 ENCOUNTER — Emergency Department
Admission: EM | Admit: 2024-04-24 | Discharge: 2024-04-24 | Disposition: A | Discharge status: Home / Self Care | Attending: Emergency Medicine | Admitting: Emergency Medicine

## 2024-04-24 ENCOUNTER — Telehealth: Payer: Self-pay

## 2024-04-24 ENCOUNTER — Emergency Department

## 2024-04-24 DIAGNOSIS — I10 Essential (primary) hypertension: Secondary | ICD-10-CM | POA: Insufficient documentation

## 2024-04-24 DIAGNOSIS — R0600 Dyspnea, unspecified: Secondary | ICD-10-CM | POA: Diagnosis present

## 2024-04-24 DIAGNOSIS — Z8541 Personal history of malignant neoplasm of cervix uteri: Secondary | ICD-10-CM | POA: Insufficient documentation

## 2024-04-24 DIAGNOSIS — R0602 Shortness of breath: Secondary | ICD-10-CM

## 2024-04-24 LAB — CBC
HCT: 39.9 % (ref 36.0–46.0)
Hemoglobin: 13.2 g/dL (ref 12.0–15.0)
MCH: 26.2 pg (ref 26.0–34.0)
MCHC: 33.1 g/dL (ref 30.0–36.0)
MCV: 79.3 fL — ABNORMAL LOW (ref 80.0–100.0)
Platelets: 237 10*3/uL (ref 150–400)
RBC: 5.03 MIL/uL (ref 3.87–5.11)
RDW: 12.9 % (ref 11.5–15.5)
WBC: 10.3 10*3/uL (ref 4.0–10.5)
nRBC: 0 % (ref 0.0–0.2)

## 2024-04-24 LAB — BASIC METABOLIC PANEL WITH GFR
Anion gap: 10 (ref 5–15)
BUN: 13 mg/dL (ref 6–20)
CO2: 21 mmol/L — ABNORMAL LOW (ref 22–32)
Calcium: 9.6 mg/dL (ref 8.9–10.3)
Chloride: 107 mmol/L (ref 98–111)
Creatinine, Ser: 0.64 mg/dL (ref 0.44–1.00)
GFR, Estimated: >60 mL/min (ref >60)
Glucose, Bld: 117 mg/dL — ABNORMAL HIGH (ref 70–99)
Potassium: 3.9 mmol/L (ref 3.5–5.1)
Sodium: 138 mmol/L (ref 135–145)

## 2024-04-24 LAB — D-DIMER, QUANTITATIVE: D-Dimer, Quant: <0.27 ug{FEU}/mL (ref 0.00–0.50)

## 2024-04-24 LAB — TROPONIN I (HIGH SENSITIVITY): Troponin I (High Sensitivity): 5 ng/L (ref <18)

## 2024-04-24 MED ORDER — ALBUTEROL SULFATE HFA 108 (90 BASE) MCG/ACT IN AERS: 2.0000 | INHALATION_SPRAY | Freq: Four times a day (QID) | RESPIRATORY_TRACT | 2 refills | Status: AC | PRN

## 2024-04-24 MED ORDER — IPRATROPIUM-ALBUTEROL 0.5-2.5 (3) MG/3ML IN SOLN
3.0000 mL | Freq: Once | RESPIRATORY_TRACT | Status: AC
  Administered 2024-04-24: 3 mL via RESPIRATORY_TRACT
  Filled 2024-04-24: qty 3

## 2024-04-24 NOTE — ED Triage Notes (Signed)
 Pt arrived to ED via POV complaining of SHOB. Denies pain and cough. SHOB began last night. NAD. A&O x 4.

## 2024-04-24 NOTE — Telephone Encounter (Signed)
 Pt son called that he like to move her appt earlier for labs result

## 2024-04-24 NOTE — ED Notes (Signed)
 This RN reviewed paperwork with pt. No further complaints or questions. Pt ambulated to lobby with family.

## 2024-04-24 NOTE — ED Provider Notes (Signed)
 [facility] Provider Note    Event Date/Time   First MD Initiated Contact with Patient 04/24/24 1104     (approximate)  History   Chief Complaint: Shortness of Breath  HPI  Kathryn Robbins is a 58 y.o. female with a past medical history of cervical cancer status post treatment reportedly in remission, hypertension, presents to the emergency department for shortness of breath.  According to the patient for the last 2 to 3 days she has had shortness of breath that she states has been worsening somewhat.  No chest pain.  No fever no cough or congestion.  No history of shortness of breath previously.  Physical Exam   Triage Vital Signs: ED Triage Vitals  Encounter Vitals Group     BP 04/24/24 1022 (!) 168/81     Girls Systolic BP Percentile --      Girls Diastolic BP Percentile --      Boys Systolic BP Percentile --      Boys Diastolic BP Percentile --      Pulse Rate 04/24/24 1022 80     Resp 04/24/24 1022 18     Temp 04/24/24 1022 97.7 F (36.5 C)     Temp Source 04/24/24 1022 Oral     SpO2 04/24/24 1021 100 %     Weight 04/24/24 1023 110 lb (49.9 kg)     Height 04/24/24 1023 5' 1 (1.549 m)     Head Circumference --      Peak Flow --      Pain Score 04/24/24 1022 0     Pain Loc --      Pain Education --      Exclude from Growth Chart --     Most recent vital signs: Vitals:   04/24/24 1021 04/24/24 1022  BP:  (!) 168/81  Pulse:  80  Resp:  18  Temp:  97.7 F (36.5 C)  SpO2: 100% 100%    General: Awake, no distress.  CV:  Good peripheral perfusion.  Regular rate and rhythm  Resp:  Normal effort.  Equal breath sounds bilaterally.  No wheeze rales or rhonchi. Abd:  No distention.  Soft, nontender.  No rebound or guarding. Other:  No lower extremity edema or tenderness.   ED Results / Procedures / Treatments   EKG  EKG viewed and interpreted by myself shows a sinus rhythm 86 bpm with a narrow QRS, normal axis, normal intervals, no  concerning ST changes.  Reassuring EKG.  RADIOLOGY  I have reviewed and interpreted the chest x-ray images.  No consolidation on my evaluation. Radiology is read the x-ray is negative  MEDICATIONS ORDERED IN ED: Medications  ipratropium-albuterol (DUONEB) 0.5-2.5 (3) MG/3ML nebulizer solution 3 mL (3 mLs Nebulization Given 04/24/24 1135)     IMPRESSION / MDM / ASSESSMENT AND PLAN / ED COURSE  I reviewed the triage vital signs and the nursing notes.  Patient's presentation is most consistent with acute presentation with potential threat to life or bodily function.  Patient presents to the emergency department for shortness of breath.  States has been ongoing over the last 2 days or so.  Patient has no chest pain, no cough or fever.  Clear lung sounds on exam with normal heart sounds.  Reassuring EKG normal CBC normal chemistry.  Chest x-ray is clear.  No pleuritic pain.  No edema.  We will obtain a troponin and a D-dimer as a precaution.  We will dose a breathing treatment while  awaiting results although lungs sound rather clear on exam.  Reassuringly satting 100% on room air with a normal respiratory and pulse rate.  Patient's workup is reassuring with a normal CBC chemistry negative troponin and a reassuringly negative D-dimer.  Chest x-ray is clear and EKG reassuring.  Patient states she feels better after the breathing treatment.  Possibly could be experiencing allergies or beginnings of a URI.  Will discharge with an albuterol inhaler to be used as needed and the patient will follow-up with her primary care doctor.  Patient and daughter agreeable to plan.  FINAL CLINICAL IMPRESSION(S) / ED DIAGNOSES   Dyspnea   Note:  This document was prepared using Dragon voice recognition software and may include unintentional dictation errors.   Dorothyann Drivers, MD 04/24/24 1217

## 2024-04-25 ENCOUNTER — Encounter: Payer: Self-pay | Admitting: Physician Assistant

## 2024-04-25 ENCOUNTER — Ambulatory Visit: Admitting: Physician Assistant

## 2024-04-25 VITALS — BP 132/80 | HR 76 | Temp 98.0°F | Resp 16 | Ht 61.0 in | Wt 110.0 lb

## 2024-04-25 DIAGNOSIS — F419 Anxiety disorder, unspecified: Secondary | ICD-10-CM

## 2024-04-25 DIAGNOSIS — E782 Mixed hyperlipidemia: Secondary | ICD-10-CM

## 2024-04-25 DIAGNOSIS — I10 Essential (primary) hypertension: Secondary | ICD-10-CM | POA: Diagnosis not present

## 2024-04-25 MED ORDER — ROSUVASTATIN CALCIUM 5 MG PO TABS: 5.0000 mg | ORAL_TABLET | Freq: Every day | ORAL | 3 refills | Status: AC

## 2024-04-25 NOTE — Progress Notes (Signed)
 [facility] [address] [place], [place] [address]  Internal MEDICINE  Office Visit Note  Patient Name: Kathryn Robbins  [id number]  [id number]  Date of Service: 04/25/2024  Chief Complaint  Patient presents with   Follow-up    Review labs    HPI Pt is here for routine follow up to review labs, her son is with her and assists with translating -Labs reviewed: cholesterol elevated, previously on cholesterol medication and would like to restart -B12 too high and will decrease supplement, otherwise labs look good -Not working now, a little stressed with this change -Did go to ED yesterday with SOB, thinks she had panic attack actually, but was given albuterol inhaler now if needed for SOB. Had gone for walk in the heat and then also found out a family member passed away and thinks this triggered the SOB -trying yoga now and discussed breathing techniques. Does not want medication at this time but may call in future if any need -following with GYN-ONC  Current Medication: Outpatient Encounter Medications as of 04/25/2024  Medication Sig   albuterol (VENTOLIN HFA) 108 (90 Base) MCG/ACT inhaler Inhale 2 puffs into the lungs every 6 (six) hours as needed for wheezing or shortness of breath.   estradiol  (ESTRACE  VAGINAL) 0.1 MG/GM vaginal cream Apply dime size amount on finger and apply to the vulva and at entrance of the vagina three times weekly   losartan  (COZAAR ) 50 MG tablet Take 1 tablet (50 mg total) by mouth daily.   No facility-administered encounter medications on file as of 04/25/2024.    Surgical History: Past Surgical History:  Procedure Laterality Date   COLONOSCOPY WITH PROPOFOL  N/A 04/19/2022   Procedure: COLONOSCOPY WITH PROPOFOL ;  Surgeon: Therisa Bi, MD;  Location: [facility] ENDOSCOPY;  Service: Gastroenterology;  Laterality: N/A;   HYSTEROSCOPY  08/2019   with cervical conization and D&C   INSERTION CENTRAL VENOUS ACCESS DEVICE W/ SUBCUTANEOUS PORT   09/2019   PORT-A-CATH REMOVAL     2021    Medical History: Past Medical History:  Diagnosis Date   Cancer (HCC) 2022   Cervical   History of radiation therapy    Hypertension    Personal history of chemotherapy     Family History: Family History  Problem Relation Age of Onset   Liver cancer Mother    Breast cancer Neg Hx    Colon cancer Neg Hx    Ovarian cancer Neg Hx    Endometrial cancer Neg Hx    Pancreatic cancer Neg Hx    Prostate cancer Neg Hx     Social History   Socioeconomic History   Marital status: Married    Spouse name: Not on file   Number of children: Not on file   Years of education: Not on file   Highest education level: Not on file  Occupational History   Occupation: homemaker  Tobacco Use   Smoking status: Never   Smokeless tobacco: Never  Vaping Use   Vaping status: Never Used  Substance and Sexual Activity   Alcohol use: Never   Drug use: Never   Sexual activity: Not Currently  Other Topics Concern   Not on file  Social History Narrative   Not on file   Social Drivers of Health   Financial Resource Strain: Not on file  Food Insecurity: Not on file  Transportation Needs: Not on file  Physical Activity: Not on file  Stress: Not on file  Social Connections: Not on file  Intimate  Partner Violence: Not on file      Review of Systems  Constitutional:  Negative for chills, fatigue and unexpected weight change.  HENT:  Negative for congestion, rhinorrhea, sneezing and sore throat.   Eyes:  Negative for redness.  Respiratory:  Negative for cough, chest tightness and shortness of breath.   Cardiovascular:  Negative for chest pain and palpitations.  Gastrointestinal:  Negative for constipation, diarrhea and nausea.  Genitourinary:  Negative for dysuria and frequency.  Musculoskeletal:  Negative for arthralgias, back pain and joint swelling.  Skin:  Negative for rash.  Neurological: Negative.  Negative for tremors and numbness.   Psychiatric/Behavioral:  Negative for behavioral problems (Depression), sleep disturbance and suicidal ideas. The patient is nervous/anxious.     Vital Signs: BP (!) 162/75   Pulse 76   Temp 98 F (36.7 C)   Resp 16   Ht 5' 1 (1.549 m)   Wt 110 lb (49.9 kg)   SpO2 98%   BMI 20.78 kg/m    Physical Exam Vitals and nursing note reviewed.  Constitutional:      General: She is not in acute distress.    Appearance: Normal appearance. She is well-developed and normal weight. She is not diaphoretic.  HENT:     Head: Normocephalic and atraumatic.   Eyes:     Extraocular Movements: Extraocular movements intact.   Neck:     Thyroid: No thyromegaly.     Vascular: No JVD.     Trachea: No tracheal deviation.   Cardiovascular:     Rate and Rhythm: Normal rate and regular rhythm.     Heart sounds: Normal heart sounds. No murmur heard.    No friction rub. No gallop.  Pulmonary:     Effort: Pulmonary effort is normal. No respiratory distress.     Breath sounds: No wheezing or rales.  Chest:     Chest wall: No tenderness.  Abdominal:     General: Bowel sounds are normal.   Skin:    General: Skin is warm and dry.   Neurological:     Mental Status: She is alert and oriented to person, place, and time.   Psychiatric:        Behavior: Behavior normal.        Thought Content: Thought content normal.        Judgment: Judgment normal.        Assessment/Plan: 1. Essential hypertension (Primary) Improved on recheck, continue losartan  as before  2. Mixed hyperlipidemia Will restart crestor  - rosuvastatin  (CRESTOR ) 5 MG tablet; Take 1 tablet (5 mg total) by mouth daily.  Dispense: 90 tablet; Refill: 3  3. Anxiousness Trying yoga, meditation and breathing techniques. Denies medication at this time, but may call if any concerns   General Counseling: [name] verbalizes understanding of the findings of todays visit and agrees with plan of treatment. I have discussed any  further diagnostic evaluation that may be needed or ordered today. We also reviewed her medications today. she has been encouraged to call the office with any questions or concerns that should arise related to todays visit.    No orders of the defined types were placed in this encounter.   No orders of the defined types were placed in this encounter.   This patient was seen by Tinnie Pro, PA-C in collaboration with Dr. Sigrid Bathe as a part of collaborative Robbins agreement.   Total time spent:30 Minutes Time spent includes review of chart, medications, test results, and follow up  plan with the patient.      Dr Fozia M Khan Internal medicine

## 2024-04-29 ENCOUNTER — Telehealth: Payer: Self-pay | Admitting: *Deleted

## 2024-04-29 NOTE — Telephone Encounter (Signed)
 Spoke with Patient's son Nishit who called the office and left a message about making an appt. For his mother. Called the patient back through [facility] [phone] and call was disconnected. Pt's son called back and was able to have his mother close by the phone for translation.   Pt's son states that his mother had more bleeding that started June 16th ( dark red blood with a pad that was saturated 50% x1), then spotted (17 th& 18 th) until the 18 th, 20, 21st, 22nd and then again on the 25 th. States the blood is dark red, gushes once a day and the most saturates peri pad 50%. Pt denies fever, chill and or pain. Pt states she doesn't know exactly where the bleeding is coming from. Denies all urinary symptoms. Advised patient to email or send another picture via MyChart. Pt's son was provided with our secure email address. Also advised  Eleanor Epps, NP has sent a referral to [facility] to rule out urological cause. Nishit verbalized understanding and thanked the office.

## 2024-05-02 ENCOUNTER — Telehealth: Payer: Self-pay | Admitting: *Deleted

## 2024-05-02 NOTE — Telephone Encounter (Signed)
 Spoke with patient's son Nishit. Relayed to him that we have received the email with the pictures of patient's pads with bleeding. Pt's son reports that she hasn't had any more bleeding in the last two days and it's been very random when she does.   Reiterated that if his mother has any increase in bleeding, with clots or without and it's running down her legs and saturating a pad, develops other symptoms such as fever, chills or pain to call the after hours number [phone] and or take her to the ED for evaluation. Pt's son verbalized understanding and thanked the office for calling and also confirmed his mother has an appt. With [facility] on July 22 at 0830 and he is also aware to call our office with any new concerns or questions next week.

## 2024-05-06 NOTE — Telephone Encounter (Signed)
 Spoke with patient's son Nishit who asked his mother if her bleeding was like a gush and the same amount randomly while she was in [place] prior to her MRI she had done there?  Pt's son states that yes, the bleeding was the same as it is now and it's random. Currently pt has not had any bleeding since Friday July 4th.  States the Doctor that saw his Mother while she was in [place] couldn't find anything wrong.   Advised patient and her son to call the office if the bleeding starts up again and is heavy as advised from previous call. Reminded of appt. With [facility] on Tuesday, July 22nd. Pt and her son verbalized understanding and thanked the office for calling.

## 2024-05-10 ENCOUNTER — Ambulatory Visit: Admitting: Physician Assistant

## 2024-06-28 ENCOUNTER — Telehealth: Payer: Self-pay | Admitting: *Deleted

## 2024-06-28 NOTE — Telephone Encounter (Signed)
 Spoke with patient's son Nishit who reports that his mother's appointment with urologist went well and they couldn't find a cause for her bleeding? He is not sure if she has anymore follow up visits with urology.  Nishit reports that his mother is not having any more bleeding, just very little spotting on occasion.   Reminded Mr. Rosene that his mother has a follow up appt. With Dr. Viktoria on 09/13/24 at 3 pm and the office will call a week before that appointment as a reminder. Mr. Fifield verbalized understanding and thanked the office for calling.

## 2024-07-17 ENCOUNTER — Telehealth: Payer: Self-pay | Admitting: *Deleted

## 2024-07-17 NOTE — Telephone Encounter (Signed)
 Attempted to reach patient via [employer] id [phone]. LVM requesting call back.

## 2024-07-17 NOTE — Telephone Encounter (Signed)
 Spoke with patient's son Nishit, who returned call from office. States his mother had started having brownish spotting two weeks ago, and then last night had bright-dark red heavier bleeding on her pad with a small clot noted on photo's that were sent.  Patient's son state that she is not having any pain, fever and or chills. States the bleeding started out as just when she wiped after going to the bathroom and believes the blood is coming from when she urinates? But he can't recall this information as his mother is not currently with him. He states she is at work right now? And we can call her at 4 pm today?  He states, I think it started when she was having spicy foods two weeks ago, and she had been doing some heavy lifting at work but nothing over 10 lbs?  States she is no longer bleeding as of this morning. And she does have a follow up with [facility] on Tuesday, September 30 th.   Advised son that the message will be relayed to providers and the office will call back with recommendations.

## 2024-07-17 NOTE — Telephone Encounter (Signed)
 Attempted to reach patient and her son after they left a message after 5pm on the office voicemail last night and sent email at 6:20 pm to office with pictures. Left voicemail requesting call back.

## 2024-07-17 NOTE — Telephone Encounter (Signed)
 Attempted to reach patient through [employer] Interpreter id# [phone] to speak with patient in regards to most recent bleeding. Left voicemail requesting call back to [phone].

## 2024-07-18 NOTE — Telephone Encounter (Signed)
 Spoke with patient's son. Patient was given an appointment with Dr. Rogelio for Tuesday, October 7 th. At 3:45pm (time requested per son states this is the only time that will work).

## 2024-08-06 ENCOUNTER — Inpatient Hospital Stay: Attending: Gynecologic Oncology | Admitting: Obstetrics & Gynecology

## 2024-08-06 ENCOUNTER — Encounter (HOSPITAL_BASED_OUTPATIENT_CLINIC_OR_DEPARTMENT_OTHER): Attending: Internal Medicine | Admitting: Internal Medicine

## 2024-08-06 VITALS — BP 146/86 | HR 74 | Temp 98.1°F | Resp 19 | Wt 116.0 lb

## 2024-08-06 DIAGNOSIS — N771 Vaginitis, vulvitis and vulvovaginitis in diseases classified elsewhere: Secondary | ICD-10-CM | POA: Insufficient documentation

## 2024-08-06 DIAGNOSIS — Z8541 Personal history of malignant neoplasm of cervix uteri: Secondary | ICD-10-CM

## 2024-08-06 DIAGNOSIS — N3041 Irradiation cystitis with hematuria: Secondary | ICD-10-CM

## 2024-08-06 DIAGNOSIS — Z923 Personal history of irradiation: Secondary | ICD-10-CM | POA: Diagnosis not present

## 2024-08-06 DIAGNOSIS — C531 Malignant neoplasm of exocervix: Secondary | ICD-10-CM

## 2024-08-06 DIAGNOSIS — Z9221 Personal history of antineoplastic chemotherapy: Secondary | ICD-10-CM | POA: Insufficient documentation

## 2024-08-06 DIAGNOSIS — Z08 Encounter for follow-up examination after completed treatment for malignant neoplasm: Secondary | ICD-10-CM | POA: Diagnosis not present

## 2024-08-06 DIAGNOSIS — R319 Hematuria, unspecified: Secondary | ICD-10-CM

## 2024-08-06 NOTE — Progress Notes (Addendum)
 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 in [place] . Biopsies on 07/02/2019 showed at least CIN-3, invasive carcinoma cannot be excluded.  Endometrial biopsy shows at least CIN-3.  Endometrium noted.  P16 staining is positive. On exam in late October, 2020, vaginal involvement was noted. Biopsies on 08/14/2019: D&C shows moderately to poorly differentiated keratinizing squamous cell carcinoma.  Normal endometrial mucosa not identified.  Endocervical curettage shows at least fragments of malignant/dysplastic squamous cells.  Cervical biopsy shows fragments of squamous mucosa with foci of invasive moderately differentiated squamous cell carcinoma.  LEEP shows invasive moderately to poorly differentiated squamous cell carcinoma, extensive.  Inked margins positive for tumor.  Posterior cervix, LEEP: Again showed squamous cell carcinoma, tumor involves all fragments extensively. PET scan on 08/30/2019: Intense FDG metabolism of the cervix with mildly increased metabolism within the vaginal vault.  No distant metastatic disease. Radiation treatment summary: 19 Gray to the cervix and pelvic lymph nodes, 26 fractions, dose per fraction 1.8 Gray. Patient then received HDR with T&O, total dose of 30 Gray, 6 fractions. She completed radiation on 11/21/2019.   She was seen by radiation oncology on 03/05/21 and was NED at that time on exam. CT C/A/P from 03/03/21 revealed no metastatic disease. She was seen again in 08/2021 and continued to be NED at that time, some vaginal stenosis. CT C/A/P 08/31/21 revealed no evidence of disease. The patient reported moving from [place]  in November last year to join family in the area.    She underwent a CT scan on 10/04/2023 as a  surveillance exam with no evidence of metastatic disease in the abdomen or pelvis, stable small uterus with no discrete uterine cervix mass, stable 11 mm left adrenal nodule felt to be a benign adenoma,. Left adrenal nodularity seen on previous scans from [place] . Her last office check up was on 02/2024 with NED on exam.   Recently seen in consultation by Urology.  Findings were compatible w/post radiation changes.  Recommended hyperbaric oxygen therapy.  Interval History: The pt was interviewed w/assistance of a translator in her native language. Discussed the use of AI scribe software for clinical note transcription with the patient, who gave verbal consent to proceed.  She continues to experience episodic vaginal bleeding.    Review of Systems: GU; vaginal bleeding; neg for dysuria  Current Meds:  Outpatient Encounter Medications as of 08/06/2024  Medication Sig   albuterol  (VENTOLIN  HFA) 108 (90 Base) MCG/ACT inhaler Inhale 2 puffs into the lungs every 6 (six) hours as needed for wheezing or shortness of breath.   estradiol  (ESTRACE  VAGINAL) 0.1 MG/GM vaginal cream Apply dime size amount on finger and apply to the vulva and at entrance of the vagina three times weekly   losartan  (COZAAR ) 50 MG tablet Take 1 tablet (50 mg total) by mouth daily.   rosuvastatin  (CRESTOR ) 5 MG tablet Take 1 tablet (5 mg total) by mouth daily.   No facility-administered encounter medications on file as of 08/06/2024.    Allergy:  Allergies  Allergen Reactions   Cefadroxil Itching   Keflex [Cephalexin] Rash    Social Hx:   Social History   Socioeconomic History   Marital status: Married    Spouse name: Not on file   Number of children: Not on file  Years of education: Not on file   Highest education level: Not on file  Occupational History   Occupation: homemaker  Tobacco Use   Smoking status: Never   Smokeless tobacco: Never  Vaping Use   Vaping status: Never Used  Substance and Sexual  Activity   Alcohol use: Never   Drug use: Never   Sexual activity: Not Currently  Other Topics Concern   Not on file  Social History Narrative   Not on file   Social Drivers of Health   Financial Resource Strain: Not on file  Food Insecurity: Not on file  Transportation Needs: Not on file  Physical Activity: Not on file  Stress: Not on file  Social Connections: Not on file  Intimate Partner Violence: Not on file    Past Surgical Hx:  Past Surgical History:  Procedure Laterality Date   COLONOSCOPY WITH PROPOFOL  N/A 04/19/2022   Procedure: COLONOSCOPY WITH PROPOFOL ;  Surgeon: Therisa Bi, MD;  Location: [facility] ENDOSCOPY;  Service: Gastroenterology;  Laterality: N/A;   HYSTEROSCOPY  08/2019   with cervical conization and D&C   INSERTION CENTRAL VENOUS ACCESS DEVICE W/ SUBCUTANEOUS PORT  09/2019   PORT-A-CATH REMOVAL     2021    Past Medical Hx:  Past Medical History:  Diagnosis Date   Cancer (HCC) 2022   Cervical   History of radiation therapy    Hypertension    Personal history of chemotherapy     Family Hx:  Family History  Problem Relation Age of Onset   Liver cancer Mother    Breast cancer Neg Hx    Colon cancer Neg Hx    Ovarian cancer Neg Hx    Endometrial cancer Neg Hx    Pancreatic cancer Neg Hx    Prostate cancer Neg Hx     Vitals:  BP (!) 146/86 Comment: manual recheck, No S&S pt to monitor at home and f/u with PCP (per interpreter)  Pulse 74   Temp 98.1 F (36.7 C) (Oral)   Resp 19   Wt 116 lb (52.6 kg)   SpO2 100%   BMI 21.92 kg/m    Exam: General: Well developed, well nourished female in no acute distress. Alert and oriented x 3.  Lymph node survey: No inguinal adenopathy.  Cardiovascular: Regular rate and rhythm. S1 and S2 normal.  Lungs: Clear to auscultation bilaterally. No wheezes/crackles/rhonchi noted.  Skin: No rashes or lesions present. Abdomen: Abdomen soft, non-tender and non-obese. Active bowel sounds in all quadrants. No  evidence of a fluid wave or abdominal masses.  Genitourinary:    Vulva/vagina: Normal external female genitalia. No lesions.    Urethra: No lesions or masses, radiation changes with atrophy of peri-urethral tissue, more prominent blood vessels near urethra.    Vagina: Normal appearing external genitalia except for radiation changes without erythema, excoriation, or lesions. Agglutination of the vagina.  No active bleeding or discharge noted.  Rectal exam: no nodularity or masses palpated. Small amount of stool obtained for hemoccult.  Extremities: No bilateral cyanosis, edema, or clubbing.   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 Persistent episodic bleeding in the absence of radiologic findings suggestive of recurrent disease.  Sxs likely 2/2 radiation complications.  - strategies for persistent non-healing issues include pentoxifylline or hyperbaric oxygen   I personally spent 25 minutes face-to-face and non-face-to-face in  the care of this patient, which includes all pre, intra, and post visit time on the date of service.   Olam Mill, MD 08/06/2024, 2:32 PM

## 2024-08-06 NOTE — Patient Instructions (Signed)
 Continue with the wound oxygen appointments. We are hopeful this will help.   We will plan for an MRI at the end of October to check the uterus and cervix.  On exam today, things appear stable and there is no evidence of a mass.   We will plan for a phone visit after you have the MRI.  Plan to follow up in six months or sooner if needed. Please call the office for any needs, concerns, or new symptoms.  Symptoms to report to your health care team include vaginal bleeding, rectal bleeding, bloating, weight loss without effort, new and persistent pain, new and  persistent fatigue, new leg swelling, new masses (i.e., bumps in your neck or groin), new and persistent cough, new and persistent nausea and vomiting, change in bowel or bladder habits, and any other concerns.

## 2024-08-07 ENCOUNTER — Encounter: Payer: Self-pay | Admitting: Obstetrics & Gynecology

## 2024-08-09 ENCOUNTER — Telehealth: Payer: Self-pay | Admitting: *Deleted

## 2024-08-09 NOTE — Telephone Encounter (Signed)
 Fax order, demographics and authorization for the MRI to [facility] at [facility] [phone])

## 2024-08-15 ENCOUNTER — Telehealth: Payer: Self-pay | Admitting: *Deleted

## 2024-08-15 NOTE — Telephone Encounter (Signed)
 Per authorization department for the MRI scan spoke with the son and explained that insurance may not cover the full cost of the scan. Explained that the facility where the scan is at will call them with the insurance information. (Cost covered and cost to be paid) Patient's son verbalized understanding.

## 2024-08-23 ENCOUNTER — Ambulatory Visit

## 2024-08-27 ENCOUNTER — Telehealth: Payer: Self-pay | Admitting: *Deleted

## 2024-08-27 NOTE — Telephone Encounter (Signed)
 Called and moved appt from 10/29 to 11/4. LMOM for the son to call the office back

## 2024-08-28 ENCOUNTER — Inpatient Hospital Stay: Admitting: Obstetrics & Gynecology

## 2024-09-03 ENCOUNTER — Inpatient Hospital Stay: Attending: Gynecologic Oncology | Admitting: Obstetrics & Gynecology

## 2024-09-03 ENCOUNTER — Inpatient Hospital Stay
Admission: RE | Admit: 2024-09-03 | Discharge: 2024-09-03 | Disposition: A | Payer: Self-pay | Source: Ambulatory Visit | Attending: Gynecologic Oncology | Admitting: Gynecologic Oncology

## 2024-09-03 ENCOUNTER — Other Ambulatory Visit: Payer: Self-pay

## 2024-09-03 DIAGNOSIS — R319 Hematuria, unspecified: Secondary | ICD-10-CM

## 2024-09-03 DIAGNOSIS — C531 Malignant neoplasm of exocervix: Secondary | ICD-10-CM | POA: Diagnosis not present

## 2024-09-03 NOTE — Progress Notes (Signed)
 Virtual Visit via Telephone Encounter:  I connected with Kathryn Robbins on 09/03/24 at  5:00 PM EST by telephone and verified that I am speaking with the correct person using two identifiers.  Location: Patient: Home Provider: Clinic   The interview was conducted with the assistance of an interpreter in her native language. I discussed the limitations, risks, security and privacy concerns of performing an evaluation and management service by telephone and the availability of in person appointments. I also discussed with the patient that there may be a patient responsible charge related to this service. The patient expressed understanding and agreed to proceed.  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   Discussed the use of AI scribe software for clinical note transcription with the patient, who gave verbal consent to proceed. She has not yet started oxygen therapy due to her new job, which prevents her from taking time off for the next three months.  She inquired whether the oxygen therapy would help stop her bleeding. She is awaiting further information on the duration of the therapy as it will be determined by the treatment team.  She has started a new job and is unable to take time off for the next three months.   Review or prior data: Pelvic MRI 10/25: No definitive evidence of suspicious residual cervical mass  I discussed the assessment and treatment plan with the patient. Continue w/hyperbaric oxygent as planned.  In the interim she may be a candidate for pentoxifylline. The patient was provided an opportunity to ask questions and all were answered. The patient agreed with the plan and demonstrated an understanding of the instructions.   The patient was advised to call back or seek an in-person  evaluation if the symptoms worsen or if the condition fails to improve as anticipated.  I provided 20 minutes of non-face-to-face time during this encounter.

## 2024-09-13 ENCOUNTER — Ambulatory Visit: Admitting: Gynecologic Oncology

## 2024-09-13 ENCOUNTER — Encounter: Payer: Self-pay | Admitting: Gynecologic Oncology

## 2024-09-16 ENCOUNTER — Telehealth: Payer: Self-pay | Admitting: *Deleted

## 2024-09-16 ENCOUNTER — Other Ambulatory Visit: Payer: Self-pay | Admitting: *Deleted

## 2024-09-16 MED ORDER — PENTOXIFYLLINE ER 400 MG PO TBCR: 400.0000 mg | EXTENDED_RELEASE_TABLET | Freq: Two times a day (BID) | ORAL | 2 refills | Status: AC

## 2024-09-16 NOTE — Telephone Encounter (Signed)
 Called patient's pharmacy -[facility] in [place] and called in Rx prescribed by Dr. Olam Mill, Pentoxifylline 400 mg BID. Pharmacy will call patient when the medication is ready for pick up.

## 2024-09-17 ENCOUNTER — Telehealth: Payer: Self-pay | Admitting: *Deleted

## 2024-09-17 NOTE — Telephone Encounter (Signed)
 Attempted to reach patient through [employer] [facility] Interpreter [phone] to relay message from Eleanor Epps, NP. Left voicemail requesting call back to [phone].

## 2024-09-17 NOTE — Telephone Encounter (Signed)
-----   Message from Eleanor JONETTA Epps sent at 09/16/2024  1:27 PM EST ----- Please let the patient know Dr. Rogelio has called in a medication to take to help increase blood flow to the tissues of the vulva. She will need to monitor the bleeding and call for heavy bleeding.   Common side effects are often gastrointestinal in nature (nausea, vomiting, belching, gas). If these effects occur, a healthcare provider may adjust the dosage.  Serious side effects are less common but can include signs of bleeding, chest pain (angina), or allergic reactions, which require immediate medical attention. Patients at risk of bleeding (e.g., those with peptic ulcers or recent surgery) require regular monitoring.   Also please let her know about the vitamin E 1000 units daily. She can pick this up over the counter.

## 2024-09-18 NOTE — Telephone Encounter (Signed)
 2nd attempt to reach patient via [facility] Interpreter id# [phone], left voicemail requesting call back to [phone].

## 2024-09-19 ENCOUNTER — Telehealth: Payer: Self-pay | Admitting: *Deleted

## 2024-09-19 NOTE — Telephone Encounter (Signed)
 3 rd attempt to reach patient and her son in regards to medication that was sent to her pharmacy prescribed by Dr. Rogelio. Left voicemail requesting call back to [phone].

## 2024-09-19 NOTE — Telephone Encounter (Signed)
 3rd attempt to reach patient to relay Rx recommendations. LVM requesting call back to [phone]

## 2024-11-01 ENCOUNTER — Ambulatory Visit: Admitting: Physician Assistant

## 2025-02-14 ENCOUNTER — Ambulatory Visit: Admitting: Gynecologic Oncology

## 2025-04-14 ENCOUNTER — Encounter: Admitting: Physician Assistant
